# Patient Record
Sex: Male | Born: 1985 | Race: Black or African American | Hispanic: No | Marital: Married | Smoking: Current every day smoker
Health system: Southern US, Community
[De-identification: ages and names within clinical notes are randomized; demographics above are authoritative.]

## PROBLEM LIST (undated history)

## (undated) DIAGNOSIS — J45909 Unspecified asthma, uncomplicated: Secondary | ICD-10-CM

---

## 2012-10-01 ENCOUNTER — Emergency Department (HOSPITAL_COMMUNITY)
Admission: EM | Admit: 2012-10-01 | Discharge: 2012-10-01 | Disposition: A | Discharge status: Home / Self Care | Payer: Medicaid Other | Source: Home / Self Care

## 2012-10-01 ENCOUNTER — Encounter (HOSPITAL_COMMUNITY): Payer: Self-pay | Admitting: Emergency Medicine

## 2012-10-01 DIAGNOSIS — W57XXXA Bitten or stung by nonvenomous insect and other nonvenomous arthropods, initial encounter: Secondary | ICD-10-CM

## 2012-10-01 DIAGNOSIS — T148 Other injury of unspecified body region: Secondary | ICD-10-CM

## 2012-10-01 MED ORDER — PERMETHRIN 5 % EX CREA: TOPICAL_CREAM | CUTANEOUS | Status: DC

## 2012-10-01 NOTE — ED Provider Notes (Signed)
Medical screening examination/treatment/procedure(s) were performed by non-physician practitioner and as supervising physician I was immediately available for consultation/collaboration.  Leslee Home, M.D.  Reuben Likes, MD 10/01/12 2214

## 2012-10-01 NOTE — ED Notes (Signed)
Instructed to put on gown for examination by provider

## 2012-10-01 NOTE — ED Provider Notes (Signed)
History     CSN: [id number]  Arrival date & time 10/01/12  1133   First MD Initiated Contact with Patient 10/01/12 1227      Chief Complaint  Patient presents with  . Rash    (Consider location/radiation/quality/duration/timing/severity/associated sxs/prior treatment) HPI Comments: 27 year old male complaining of a rash to his proximal thighs. This rash is described as an annular darker pigmented areas approximately 2-6 mm in diameter scattered about the thighs. Sometimes they itch. He has had these before and has been to emergency department at least twice with diagnosis of bed bugs.   History reviewed. No pertinent past medical history.  History reviewed. No pertinent past surgical history.  No family history on file.  History  Substance Use Topics  . Smoking status: Current Every Day Smoker  . Smokeless tobacco: Not on file  . Alcohol Use: No      Review of Systems  All other systems reviewed and are negative.    Allergies  Review of patient's allergies indicates no known allergies.  Home Medications   Current Outpatient Rx  Name  Route  Sig  Dispense  Refill  . permethrin (ELIMITE) 5 % cream      Apply to affected area once   60 g   0     BP 121/69  Pulse 65  Temp(Src) 98.1 F (36.7 C) (Oral)  Resp 16  SpO2 100%  Physical Exam  Nursing note and vitals reviewed. Constitutional: He is oriented to person, place, and time. He appears well-developed and well-nourished. No distress.  Eyes: EOM are normal.  Neck: Normal range of motion.  Pulmonary/Chest: Effort normal.  Musculoskeletal: Normal range of motion. He exhibits no edema and no tenderness.  Neurological: He is alert and oriented to person, place, and time. He exhibits normal muscle tone.  Skin: Skin is warm and dry. Rash noted.  As described in history of present illness. No other body surface areas are involved. No bleeding, drainage, testicles or signs of infection.  Psychiatric: He has  a normal mood and affect.    ED Course  Procedures (including critical care time)  Labs Reviewed - No data to display No results found.   1. Insect bites       MDM  Eli mite cream as directed Clean close, mattress, sheets, pillows in other areas around the bed as instructed. Spray the furniture, carpet, wash her clothes and follow the instructions listed on the bed but instruction sheet. Try to find a primary care doctor.         Hayden Rasmussen, NP 10/01/12 1241

## 2012-10-01 NOTE — ED Notes (Signed)
Reports rash on thighs, red, no itching.  Patient reports this has been going on for "months"

## 2012-11-14 ENCOUNTER — Emergency Department (HOSPITAL_COMMUNITY)
Admission: EM | Admit: 2012-11-14 | Discharge: 2012-11-14 | Disposition: A | Discharge status: Home / Self Care | Payer: Medicaid Other | Attending: Emergency Medicine | Admitting: Emergency Medicine

## 2012-11-14 ENCOUNTER — Encounter (HOSPITAL_COMMUNITY): Payer: Self-pay | Admitting: Emergency Medicine

## 2012-11-14 DIAGNOSIS — J45909 Unspecified asthma, uncomplicated: Secondary | ICD-10-CM | POA: Insufficient documentation

## 2012-11-14 DIAGNOSIS — R3989 Other symptoms and signs involving the genitourinary system: Secondary | ICD-10-CM | POA: Insufficient documentation

## 2012-11-14 DIAGNOSIS — Z202 Contact with and (suspected) exposure to infections with a predominantly sexual mode of transmission: Secondary | ICD-10-CM

## 2012-11-14 DIAGNOSIS — R300 Dysuria: Secondary | ICD-10-CM | POA: Insufficient documentation

## 2012-11-14 DIAGNOSIS — R51 Headache: Secondary | ICD-10-CM | POA: Insufficient documentation

## 2012-11-14 DIAGNOSIS — F17200 Nicotine dependence, unspecified, uncomplicated: Secondary | ICD-10-CM | POA: Insufficient documentation

## 2012-11-14 HISTORY — DX: Unspecified asthma, uncomplicated: J45.909

## 2012-11-14 LAB — URINALYSIS, ROUTINE W REFLEX MICROSCOPIC
Glucose, UA: NEGATIVE mg/dL
Hgb urine dipstick: NEGATIVE
Protein, ur: NEGATIVE mg/dL

## 2012-11-14 MED ORDER — AZITHROMYCIN 250 MG PO TABS
1000.0000 mg | ORAL_TABLET | Freq: Once | ORAL | Status: AC
  Administered 2012-11-14: 1000 mg via ORAL
  Filled 2012-11-14: qty 4

## 2012-11-14 MED ORDER — CEFTRIAXONE SODIUM 250 MG IJ SOLR
250.0000 mg | Freq: Once | INTRAMUSCULAR | Status: AC
Start: 2012-11-14 — End: 2012-11-14
  Administered 2012-11-14: 250 mg via INTRAMUSCULAR
  Filled 2012-11-14: qty 250

## 2012-11-14 NOTE — ED Provider Notes (Signed)
History  This chart was scribed for non-physician practitioner working with Nathaniel Collier. Nathaniel Lamas, MD by Nathaniel Collier, ED scribe. This patient was seen in room WTR5/WTR5 and the patient's care was started at 4:36 PM.  CSN: [id number] Arrival date & time 11/14/12  1610   Chief Complaint  Patient presents with  . Migraine  . burning with urination    Patient is a 27 y.o. male presenting with migraines. The history is provided by the patient. No language interpreter was used.  Migraine This is a new problem. The current episode started more than 1 week ago. The problem occurs daily. The problem has not changed since onset.Associated symptoms include headaches. Nothing aggravates the symptoms. The symptoms are relieved by acetaminophen. He has tried acetaminophen for the symptoms. The treatment provided moderate relief.    HPI Comments: Nathaniel Collier is a 27 y.o. male who presents to the Emergency Department complaining of gradual onset, intermittent headaches that started 3 weeks ago and dysuria that started this morning. He states the migraines normally last about 30 minutes then go away. He states he has taken tylenol with some relief. Headache usually improves with sleeping.  Denies worst headache of life, fever, neck stiffness, rash.  Pt states he has lost his appetite. Pt states he would like to be tested for chlamydia because his ex partner informed him that she was treated for it one week ago. Pt denies visual disturbance, penile discharge and rash as associated symptoms.   Past Medical History  Diagnosis Date  . Asthma    History reviewed. No pertinent past surgical history. No family history on file. History  Substance Use Topics  . Smoking status: Current Every Day Smoker  . Smokeless tobacco: Not on file  . Alcohol Use: No    Review of Systems  Genitourinary: Positive for dysuria.  Neurological: Positive for headaches.  All other systems reviewed and are  negative.    Allergies  Review of patient's allergies indicates no known allergies.  Home Medications   Current Outpatient Rx  Name  Route  Sig  Dispense  Refill  . acetaminophen (TYLENOL) 500 MG tablet   Oral   Take 1,000 mg by mouth every 6 (six) hours as needed for pain.          BP 150/80  Pulse 76  Temp(Src) 98 F (36.7 C) (Oral)  Resp 17  Ht 6\' 3"  (1.905 m)  Wt 150 lb 8 oz (68.266 kg)  BMI 18.81 kg/m2  SpO2 100%  Physical Exam  Nursing note and vitals reviewed. Constitutional: He is oriented to person, place, and time. He appears well-developed and well-nourished. No distress.  Non toxic in appearance.   HENT:  Head: Normocephalic and atraumatic.  Mouth/Throat: Oropharynx is clear and moist.  Eyes: EOM are normal. Pupils are equal, round, and reactive to light.  Neck: Normal range of motion. Neck supple. No tracheal deviation present.  No meningismal signs.   Cardiovascular: Normal rate.   Pulmonary/Chest: Effort normal. No respiratory distress.  Genitourinary: Penis normal. No penile tenderness.  No hernia.  Musculoskeletal: Normal range of motion.  Neurological: He is alert and oriented to person, place, and time.  Skin: Skin is warm and dry.  Psychiatric: He has a normal mood and affect. His behavior is normal.    ED Course  Procedures (including critical care time)  DIAGNOSTIC STUDIES: Oxygen Saturation is 100% on RA, normal by my interpretation.    COORDINATION OF CARE: 4:47 PM-Discussed treatment plan which  includes OTC medication for migraine with pt at bedside and pt agreed to plan. No red flags.  No focal neuro deficits concerning for stroke, no thunderclap headache concerning for SAH, no nuchal rigidity or fever concerning for meningitis.    4:59 PM Will check UA.  Will treat for possible STD with rocephin/zithromax.  5:36 PM UA neg for infection.  Pt made aware that GC/Ch culture sent, will notify if positive.  Practice safe sex.    Labs  Reviewed  GC/CHLAMYDIA PROBE AMP  URINALYSIS, ROUTINE W REFLEX MICROSCOPIC   No results found. 1. Headache   2. Possible exposure to STD     MDM  BP 150/80  Pulse 76  Temp(Src) 98 F (36.7 C) (Oral)  Resp 17  Ht 6\' 3"  (1.905 m)  Wt 150 lb 8 oz (68.266 kg)  BMI 18.81 kg/m2  SpO2 100%  I personally performed the services described in this documentation, which was scribed in my presence. The recorded information has been reviewed and is accurate.     Fayrene Helper, PA-C 11/14/12 1737

## 2012-11-14 NOTE — ED Provider Notes (Signed)
Medical screening examination/treatment/procedure(s) were performed by non-physician practitioner and as supervising physician I was immediately available for consultation/collaboration.   [name]. Jovana Rembold, MD 11/14/12 1750

## 2012-11-14 NOTE — ED Notes (Signed)
Pt states that he has been having migraine and no appetite and weakness times three weeks.  Pt also requesting to be tested for chlamydia sue to pt's ex partner informed him that she was treated for it week ago.  Pt states that he had stinging/burning when he urinated this morning.

## 2014-09-05 DIAGNOSIS — L738 Other specified follicular disorders: Secondary | ICD-10-CM | POA: Insufficient documentation

## 2015-01-12 ENCOUNTER — Encounter (HOSPITAL_COMMUNITY): Payer: Self-pay | Admitting: *Deleted

## 2015-01-12 ENCOUNTER — Emergency Department (HOSPITAL_COMMUNITY)
Admission: EM | Admit: 2015-01-12 | Discharge: 2015-01-12 | Disposition: A | Discharge status: Home / Self Care | Payer: Medicaid Other | Source: Home / Self Care | Attending: Family Medicine | Admitting: Family Medicine

## 2015-01-12 DIAGNOSIS — J300 Vasomotor rhinitis: Secondary | ICD-10-CM

## 2015-01-12 MED ORDER — METHYLPREDNISOLONE ACETATE 80 MG/ML IJ SUSP
INTRAMUSCULAR | Status: AC
  Filled 2015-01-12: qty 1

## 2015-01-12 MED ORDER — IPRATROPIUM BROMIDE 0.06 % NA SOLN: 2.0000 | Freq: Four times a day (QID) | NASAL | Status: DC

## 2015-01-12 MED ORDER — ALBUTEROL SULFATE HFA 108 (90 BASE) MCG/ACT IN AERS: 2.0000 | INHALATION_SPRAY | Freq: Four times a day (QID) | RESPIRATORY_TRACT | Status: DC | PRN

## 2015-01-12 MED ORDER — METHYLPREDNISOLONE ACETATE 80 MG/ML IJ SUSP
80.0000 mg | Freq: Once | INTRAMUSCULAR | Status: AC
  Administered 2015-01-12: 80 mg via INTRAMUSCULAR

## 2015-01-12 NOTE — ED Notes (Signed)
Pt  Reports  Symptoms  Of   Cough  Congestion  Nasal  stuffyness    With  Onset  Of symptoms  X  4  Days        Pt  States  Was  At the  [facility]  When the  Symptoms  Began     -  Pt     Reports   Has  A  History of  Asthma  But takes no meds    He  Is  A  smoker

## 2015-01-12 NOTE — ED Provider Notes (Signed)
CSN: [id number]     Arrival date & time 01/12/15  1355 History   First MD Initiated Contact with Patient 01/12/15 1421     Chief Complaint  Patient presents with  . URI   (Consider location/radiation/quality/duration/timing/severity/associated sxs/prior Treatment) Patient is a 29 y.o. male presenting with URI. The history is provided by the patient.  URI Presenting symptoms: congestion, cough and rhinorrhea   Presenting symptoms: no fever   Severity:  Moderate Onset quality:  Gradual Duration:  4 days Progression:  Unchanged Chronicity:  New Relieved by:  None tried Exacerbated by: smoking, A/C. Ineffective treatments:  None tried (does not have an asthma mdi) Associated symptoms: wheezing     Past Medical History  Diagnosis Date  . Asthma    History reviewed. No pertinent past surgical history. History reviewed. No pertinent family history. Social History  Substance Use Topics  . Smoking status: Current Every Day Smoker  . Smokeless tobacco: None  . Alcohol Use: No    Review of Systems  Constitutional: Negative.  Negative for fever.  HENT: Positive for congestion and rhinorrhea.   Respiratory: Positive for cough and wheezing. Negative for shortness of breath.   Cardiovascular: Negative.     Allergies  Review of patient's allergies indicates no known allergies.  Home Medications   Prior to Admission medications   Medication Sig Start Date End Date Taking? Authorizing Provider  acetaminophen (TYLENOL) 500 MG tablet Take 1,000 mg by mouth every 6 (six) hours as needed for pain.    Historical Provider, MD  albuterol (PROVENTIL HFA;VENTOLIN HFA) 108 (90 BASE) MCG/ACT inhaler Inhale 2 puffs into the lungs every 6 (six) hours as needed for wheezing or shortness of breath. 01/12/15   Linna Hoff, MD  ipratropium (ATROVENT) 0.06 % nasal spray Place 2 sprays into both nostrils 4 (four) times daily. 01/12/15   Linna Hoff, MD   BP 121/80 mmHg  Pulse 81  Temp(Src) 98.3  F (36.8 C) (Oral)  Resp 18  SpO2 99% Physical Exam  Constitutional: He is oriented to person, place, and time. He appears well-developed and well-nourished. No distress.  HENT:  Right Ear: External ear normal.  Left Ear: External ear normal.  Nose: Mucosal edema and rhinorrhea present.  Mouth/Throat: Oropharynx is clear and moist.  Eyes: Pupils are equal, round, and reactive to light.  Neck: Normal range of motion. Neck supple.  Cardiovascular: Normal rate, regular rhythm, normal heart sounds and intact distal pulses.   Pulmonary/Chest: Effort normal and breath sounds normal. He has no wheezes. He has no rales.  Lymphadenopathy:    He has no cervical adenopathy.  Neurological: He is alert and oriented to person, place, and time.  Skin: Skin is warm and dry.  Nursing note and vitals reviewed.   ED Course  Procedures (including critical care time) Labs Review Labs Reviewed - No data to display  Imaging Review No results found.   MDM   1. Vasomotor rhinitis        Linna Hoff, MD 01/12/15 1444

## 2015-01-12 NOTE — Discharge Instructions (Signed)
Take all of medicine, drink lots of fluids, no more smoking, see your doctor if further problems  °

## 2015-01-20 ENCOUNTER — Emergency Department (HOSPITAL_COMMUNITY): Payer: Worker's Compensation

## 2015-01-20 ENCOUNTER — Emergency Department (HOSPITAL_COMMUNITY)
Admission: EM | Admit: 2015-01-20 | Discharge: 2015-01-20 | Disposition: A | Discharge status: Home / Self Care | Payer: Worker's Compensation | Attending: Emergency Medicine | Admitting: Emergency Medicine

## 2015-01-20 ENCOUNTER — Encounter (HOSPITAL_COMMUNITY): Payer: Self-pay | Admitting: Emergency Medicine

## 2015-01-20 DIAGNOSIS — T148XXA Other injury of unspecified body region, initial encounter: Secondary | ICD-10-CM

## 2015-01-20 DIAGNOSIS — Z79899 Other long term (current) drug therapy: Secondary | ICD-10-CM | POA: Diagnosis not present

## 2015-01-20 DIAGNOSIS — X58XXXA Exposure to other specified factors, initial encounter: Secondary | ICD-10-CM | POA: Diagnosis not present

## 2015-01-20 DIAGNOSIS — Y990 Civilian activity done for income or pay: Secondary | ICD-10-CM | POA: Diagnosis not present

## 2015-01-20 DIAGNOSIS — S8012XA Contusion of left lower leg, initial encounter: Secondary | ICD-10-CM | POA: Diagnosis not present

## 2015-01-20 DIAGNOSIS — Y9389 Activity, other specified: Secondary | ICD-10-CM | POA: Insufficient documentation

## 2015-01-20 DIAGNOSIS — Y9289 Other specified places as the place of occurrence of the external cause: Secondary | ICD-10-CM | POA: Diagnosis not present

## 2015-01-20 DIAGNOSIS — J45909 Unspecified asthma, uncomplicated: Secondary | ICD-10-CM | POA: Diagnosis not present

## 2015-01-20 DIAGNOSIS — S80812A Abrasion, left lower leg, initial encounter: Secondary | ICD-10-CM | POA: Diagnosis not present

## 2015-01-20 DIAGNOSIS — Z720 Tobacco use: Secondary | ICD-10-CM | POA: Insufficient documentation

## 2015-01-20 DIAGNOSIS — S99922A Unspecified injury of left foot, initial encounter: Secondary | ICD-10-CM | POA: Diagnosis present

## 2015-01-20 DIAGNOSIS — S9032XA Contusion of left foot, initial encounter: Secondary | ICD-10-CM | POA: Diagnosis not present

## 2015-01-20 MED ORDER — TRAMADOL HCL 50 MG PO TABS: 50.0000 mg | ORAL_TABLET | Freq: Four times a day (QID) | ORAL | Status: DC | PRN

## 2015-01-20 MED ORDER — IBUPROFEN 200 MG PO TABS
600.0000 mg | ORAL_TABLET | Freq: Once | ORAL | Status: AC
  Administered 2015-01-20: 600 mg via ORAL
  Filled 2015-01-20: qty 3

## 2015-01-20 MED ORDER — IBUPROFEN 600 MG PO TABS: 600.0000 mg | ORAL_TABLET | Freq: Four times a day (QID) | ORAL | Status: DC | PRN

## 2015-01-20 NOTE — ED Notes (Signed)
Pt's left foot was run over by a heavy machine at [facility] of how heavy the machine was. Having increased pain and decreased ROM with second and third toes. Has small abrasion on left lower leg. Unsure of last tetanus shot. 1+ pedal pulses noted.

## 2015-01-20 NOTE — Discharge Instructions (Signed)
Contusion A contusion is a deep bruise. Contusions happen when an injury causes bleeding under the skin. Signs of bruising include pain, puffiness (swelling), and discolored skin. The contusion may turn blue, purple, or yellow. HOME CARE   Put ice on the injured area.  Put ice in a plastic bag.  Place a towel between your skin and the bag.  Leave the ice on for 15-20 minutes, 03-04 times a day.  Only take medicine as told by your doctor.  Rest the injured area.  If possible, raise (elevate) the injured area to lessen puffiness. GET HELP RIGHT AWAY IF:   You have more bruising or puffiness.  You have pain that is getting worse.  Your puffiness or pain is not helped by medicine. MAKE SURE YOU:   Understand these instructions.  Will watch your condition.  Will get help right away if you are not doing well or get worse. Document Released: 10/23/2007 Document Revised: 07/29/2011 Document Reviewed: 03/11/2011 [facility] Patient Information 2015 [place], [place]. This information is not intended to replace advice given to you by your health care provider. Make sure you discuss any questions you have with your health care provider.  Abrasions An abrasion is a cut or scrape of the skin. Abrasions do not go through all layers of the skin. HOME CARE  If a bandage (dressing) was put on your wound, change it as told by your doctor. If the bandage sticks, soak it off with warm.  Wash the area with water and soap 2 times a day. Rinse off the soap. Pat the area dry with a clean towel.  Put on medicated cream (ointment) as told by your doctor.  Change your bandage right away if it gets wet or dirty.  Only take medicine as told by your doctor.  See your doctor within 24-48 hours to get your wound checked.  Check your wound for redness, puffiness (swelling), or yellowish-white fluid (pus). GET HELP RIGHT AWAY IF:   You have more pain in the wound.  You have redness, swelling, or  tenderness around the wound.  You have pus coming from the wound.  You have a fever or lasting symptoms for more than 2-3 days.  You have a fever and your symptoms suddenly get worse.  You have a bad smell coming from the wound or bandage. MAKE SURE YOU:   Understand these instructions.  Will watch your condition.  Will get help right away if you are not doing well or get worse. Document Released: 10/23/2007 Document Revised: 01/29/2012 Document Reviewed: 04/09/2011 [facility] Patient Information 2015 [place], [place]. This information is not intended to replace advice given to you by your health care provider. Make sure you discuss any questions you have with your health care provider. Your  x-rays are negative for any fracture or dislocation

## 2015-01-20 NOTE — ED Notes (Signed)
Ace wrap applied to foot. Able to bear weight. Work note given. Sent home with ice pack. Knows to apply neosporin on left leg abrasion when returning home. Has PCP-knows if pain persists to follow up with PCP. No other c/c.

## 2015-01-20 NOTE — ED Provider Notes (Signed)
CSN: [id number]     Arrival date & time 01/20/15  0030 History   First MD Initiated Contact with Patient 01/20/15 0036     Chief Complaint  Patient presents with  . Foot Injury     (Consider location/radiation/quality/duration/timing/severity/associated sxs/prior Treatment) HPI Comments: patient state he was at work and his pallet jack wouldn't stop before he had to get on another piece of equipment and it ran over his left foot that was in a sneaker and his shin hit a pole.  He now has an abrasion to the anterior portion of his left shin abrasion to the tops of the second and third toe swelling to mid foot and tenderness from toes to knee  Patient is a 29 y.o. male presenting with foot injury. The history is provided by the patient.  Foot Injury Location:  Leg and foot Injury: yes   Mechanism of injury comment:  Run over by pallet jack Leg location:  L leg Foot location:  L foot Pain details:    Quality:  Aching   Radiates to:  Does not radiate   Severity:  Moderate   Onset quality:  Sudden   Timing:  Constant Chronicity:  New Dislocation: no   Foreign body present:  No foreign bodies Prior injury to area:  No Relieved by:  None tried Worsened by:  Activity Ineffective treatments:  None tried Associated symptoms: stiffness   Associated symptoms: no swelling and no tingling     Past Medical History  Diagnosis Date  . Asthma    History reviewed. No pertinent past surgical history. History reviewed. No pertinent family history. Social History  Substance Use Topics  . Smoking status: Current Every Day Smoker  . Smokeless tobacco: None  . Alcohol Use: No    Review of Systems  Musculoskeletal: Positive for arthralgias and stiffness.  Skin: Positive for wound.  All other systems reviewed and are negative.     Allergies  Review of patient's allergies indicates no known allergies.  Home Medications   Prior to Admission medications   Medication Sig Start Date End  Date Taking? Authorizing Provider  acetaminophen (TYLENOL) 500 MG tablet Take 1,000 mg by mouth every 6 (six) hours as needed for pain.    Historical Provider, MD  albuterol (PROVENTIL HFA;VENTOLIN HFA) 108 (90 BASE) MCG/ACT inhaler Inhale 2 puffs into the lungs every 6 (six) hours as needed for wheezing or shortness of breath. 01/12/15   Linna Hoff, MD  ibuprofen (ADVIL,MOTRIN) 600 MG tablet Take 1 tablet (600 mg total) by mouth every 6 (six) hours as needed. 01/20/15   Earley Favor, NP  ipratropium (ATROVENT) 0.06 % nasal spray Place 2 sprays into both nostrils 4 (four) times daily. 01/12/15   Linna Hoff, MD  traMADol (ULTRAM) 50 MG tablet Take 1 tablet (50 mg total) by mouth every 6 (six) hours as needed. 01/20/15   Earley Favor, NP   BP 150/93 mmHg  Pulse 63  Temp(Src) 98.7 F (37.1 C)  Resp 20  SpO2 98% Physical Exam  Constitutional: He is oriented to person, place, and time. He appears well-developed and well-nourished.  HENT:  Head: Normocephalic.  Eyes: Pupils are equal, round, and reactive to light.  Neck: Normal range of motion.  Cardiovascular: Normal rate.   Pulmonary/Chest: Effort normal.  Musculoskeletal: He exhibits tenderness. He exhibits no edema.       Legs:      Feet:  Neurological: He is alert and oriented to person, place, and  time.  Nursing note and vitals reviewed.   ED Course  Procedures (including critical care time) Labs Review Labs Reviewed - No data to display  Imaging Review Dg Tibia/fibula Left  01/20/2015   CLINICAL DATA:  Left foot and leg ran over by machine at work, with pain across the metatarsals, and laceration at the anterior distal lower leg. Initial encounter.  EXAM: LEFT TIBIA AND FIBULA - 2 VIEW  COMPARISON:  None.  FINDINGS: There is no evidence of fracture or dislocation. The tibia and fibula appear grossly intact. The known soft tissue laceration is not well characterized on radiograph. No radiopaque foreign bodies are seen.  The knee  joint is grossly unremarkable in appearance. No knee joint effusion is identified. The ankle mortise is incompletely assessed, but appears grossly unremarkable.  IMPRESSION: No evidence of fracture or dislocation.   Electronically Signed   By: Roanna Raider M.D.   On: 01/20/2015 01:26   Dg Foot Complete Left  01/20/2015   CLINICAL DATA:  Left foot and leg ran over by machine at work, with pain across the metatarsals, and laceration at the anterior distal lower leg. Initial encounter.  EXAM: LEFT FOOT - COMPLETE 3+ VIEW  COMPARISON:  None.  FINDINGS: There is no evidence of fracture or dislocation. The joint spaces are preserved. There is no evidence of talar subluxation; the subtalar joint is unremarkable in appearance.  No significant soft tissue abnormalities are seen.  IMPRESSION: No evidence of fracture or dislocation.   Electronically Signed   By: Roanna Raider M.D.   On: 01/20/2015 01:21   I have personally reviewed and evaluated these images and lab results as part of my medical decision-making.   EKG Interpretation None    x-rays are negative for fracture or dislocation  MDM   Final diagnoses:  Foot contusion, left, initial encounter  Contusion of leg, left, initial encounter  Abrasion         Earley Favor, NP 01/20/15 0136  Pricilla Loveless, MD 01/21/15 1610

## 2015-04-10 ENCOUNTER — Emergency Department (HOSPITAL_COMMUNITY)
Admission: EM | Admit: 2015-04-10 | Discharge: 2015-04-10 | Disposition: A | Discharge status: Home / Self Care | Payer: Medicaid Other | Attending: Emergency Medicine | Admitting: Emergency Medicine

## 2015-04-10 ENCOUNTER — Encounter (HOSPITAL_COMMUNITY): Payer: Self-pay | Admitting: *Deleted

## 2015-04-10 DIAGNOSIS — J45909 Unspecified asthma, uncomplicated: Secondary | ICD-10-CM | POA: Insufficient documentation

## 2015-04-10 DIAGNOSIS — S39012A Strain of muscle, fascia and tendon of lower back, initial encounter: Secondary | ICD-10-CM | POA: Diagnosis not present

## 2015-04-10 DIAGNOSIS — Y9389 Activity, other specified: Secondary | ICD-10-CM | POA: Diagnosis not present

## 2015-04-10 DIAGNOSIS — M545 Low back pain, unspecified: Secondary | ICD-10-CM

## 2015-04-10 DIAGNOSIS — F17210 Nicotine dependence, cigarettes, uncomplicated: Secondary | ICD-10-CM | POA: Insufficient documentation

## 2015-04-10 DIAGNOSIS — X500XXA Overexertion from strenuous movement or load, initial encounter: Secondary | ICD-10-CM | POA: Insufficient documentation

## 2015-04-10 DIAGNOSIS — Y9289 Other specified places as the place of occurrence of the external cause: Secondary | ICD-10-CM | POA: Diagnosis not present

## 2015-04-10 DIAGNOSIS — Y990 Civilian activity done for income or pay: Secondary | ICD-10-CM | POA: Diagnosis not present

## 2015-04-10 DIAGNOSIS — S3992XA Unspecified injury of lower back, initial encounter: Secondary | ICD-10-CM | POA: Diagnosis present

## 2015-04-10 MED ORDER — IBUPROFEN 800 MG PO TABS
800.0000 mg | ORAL_TABLET | Freq: Once | ORAL | Status: AC
  Administered 2015-04-10: 800 mg via ORAL
  Filled 2015-04-10: qty 1

## 2015-04-10 MED ORDER — METHOCARBAMOL 500 MG PO TABS: 500.0000 mg | ORAL_TABLET | Freq: Two times a day (BID) | ORAL | Status: DC

## 2015-04-10 MED ORDER — NAPROXEN 500 MG PO TABS: 500.0000 mg | ORAL_TABLET | Freq: Two times a day (BID) | ORAL | Status: DC

## 2015-04-10 NOTE — ED Notes (Signed)
Pt states that he "pulled" his back at work and it has been bothering him; pt states that it is worse after laying down and it takes him "awhile" to get moving in the morning; pt states that he asked his employer for time off to recover and find a MD but has been unable to do so; pt denies numbness, tingling or weakness; pt also states that he is out of his inhaler for asthma

## 2015-04-10 NOTE — ED Provider Notes (Signed)
CSN: [id number]     Arrival date & time 04/10/15  2143 History  By signing my name below, I, Octavia Heirrianna Nassar, attest that this documentation has been prepared under the direction and in the presence of TX[employer] CorpHannah Shihab States, PA-C. Electronically Signed: Octavia HeirArianna Nassar, ED Scribe. 04/10/2015. 11:35 PM.    Chief Complaint  Patient presents with  . Back Pain     The history is provided by the patient and medical records. No language interpreter was used.   HPI Comments: Glenford Bayleynthony Cryan is a 29 y.o. male who presents to the Emergency Department complaining of constant, gradual worsening lower back pain onset two weeks ago. Pt describes the pain as sharp tight pain that increases with certain movement. Pt has been taking 400mg  of ibuprofen to alleviate the pain with no relief. He injured his back two weeks ago at his job lifting some heavy objects. Pt denies numbness, tingling in legs, weakness, bowel/bladder incontinence, burning with urination, hx of IV drug use, hx of back problems, and hx of back surgeries.  Past Medical History  Diagnosis Date  . Asthma    History reviewed. No pertinent past surgical history. No family history on file. Social History  Substance Use Topics  . Smoking status: Current Every Day Smoker -- 0.30 packs/day    Types: Cigarettes  . Smokeless tobacco: None  . Alcohol Use: Yes     Comment: socilally    Review of Systems  Constitutional: Negative for fever and fatigue.  Respiratory: Negative for chest tightness and shortness of breath.   Cardiovascular: Negative for chest pain.  Gastrointestinal: Negative for nausea, vomiting, abdominal pain and diarrhea.  Genitourinary: Negative for dysuria, urgency, frequency and hematuria.  Musculoskeletal: Positive for back pain and gait problem ( 2/2 pain). Negative for joint swelling, neck pain and neck stiffness.  Skin: Negative for rash.  Neurological: Negative for weakness, light-headedness, numbness and headaches.  All  other systems reviewed and are negative.     Allergies  Review of patient's allergies indicates no known allergies.  Home Medications   Prior to Admission medications   Medication Sig Start Date End Date Taking? Authorizing Provider  acetaminophen (TYLENOL) 500 MG tablet Take 1,000 mg by mouth every 6 (six) hours as needed for pain.   Yes Historical Provider, MD  albuterol (PROVENTIL) (2.5 MG/3ML) 0.083% nebulizer solution Take 2.5 mg by nebulization every 6 (six) hours as needed for wheezing or shortness of breath.   Yes Historical Provider, MD  methocarbamol (ROBAXIN) 500 MG tablet Take 1 tablet (500 mg total) by mouth 2 (two) times daily. 04/10/15   Deonta Bomberger, PA-C  naproxen (NAPROSYN) 500 MG tablet Take 1 tablet (500 mg total) by mouth 2 (two) times daily with a meal. 04/10/15   Raeshawn Vo, PA-C   Triage vitals: BP 126/79 mmHg  Pulse 58  Temp(Src) 98.2 F (36.8 C) (Temporal)  Resp 18  Ht 6\' 2"  (1.88 m)  Wt 149 lb (67.586 kg)  BMI 19.12 kg/m2  SpO2 100% Physical Exam  Constitutional: He appears well-developed and well-nourished. No distress.  HENT:  Head: Normocephalic and atraumatic.  Mouth/Throat: Oropharynx is clear and moist. No oropharyngeal exudate.  Eyes: Conjunctivae are normal.  Neck: Normal range of motion. Neck supple.  Full ROM without pain  Cardiovascular: Normal rate, regular rhythm, normal heart sounds and intact distal pulses.   No murmur heard. Pulmonary/Chest: Effort normal and breath sounds normal. No respiratory distress. He has no wheezes.  Abdominal: Soft. He exhibits no distension. There  is no tenderness.  Musculoskeletal:  Full range of motion of the T-spine and L-spine No tenderness to palpation of the spinous processes of the T-spine or L-spine Mild tenderness to palpation of the paraspinous muscles of the L-spine  Lymphadenopathy:    He has no cervical adenopathy.  Neurological: He is alert. He has normal reflexes.  Reflex  Scores:      Bicep reflexes are 2+ on the right side and 2+ on the left side.      Brachioradialis reflexes are 2+ on the right side and 2+ on the left side.      Patellar reflexes are 2+ on the right side and 2+ on the left side.      Achilles reflexes are 2+ on the right side and 2+ on the left side. Speech is clear and goal oriented, follows commands Normal 5/5 strength in upper and lower extremities bilaterally including dorsiflexion and plantar flexion, strong and equal grip strength Sensation normal to light and sharp touch Moves extremities without ataxia, coordination intact Normal gait Normal balance No Clonus   Skin: Skin is warm and dry. No rash noted. He is not diaphoretic. No erythema.  Psychiatric: He has a normal mood and affect. His behavior is normal.  Nursing note and vitals reviewed.   ED Course  Procedures  DIAGNOSTIC STUDIES: Oxygen Saturation is 100% on RA, normal by my interpretation.  COORDINATION OF CARE:  11:33 PM Discussed treatment plan with pt at bedside and pt agreed to plan.  Labs Review Labs Reviewed - No data to display  Imaging Review No results found. I have personally reviewed and evaluated these images and lab results as part of my medical decision-making.   EKG Interpretation None      MDM   Final diagnoses:  Bilateral low back pain without sciatica  Lumbar strain, initial encounter   [place] Kiss presents with nontraumatic back pain.  No neurological deficits and normal neuro exam.  Patient can walk but states is painful.  No loss of bowel or bladder control.  No concern for cauda equina.  No fever, night sweats, weight loss, h/o cancer, IVDU.  RICE protocol and pain medicine indicated and discussed with patient.   BP 126/79 mmHg  Pulse 58  Temp(Src) 98.2 F (36.8 C) (Temporal)  Resp 18  Ht  (1.88 m)  Wt 67.586 kg  BMI 19.12 kg/m2  SpO2 100%    Dierdre Forth, PA-C 04/11/15 [id number]  Benjiman Core,  MD 04/17/15 [phone]

## 2015-04-10 NOTE — Discharge Instructions (Signed)
1. Medications: robaxin, naproxyn, usual home medications °2. Treatment: rest, drink plenty of fluids, gentle stretching as discussed, alternate ice and heat °3. Follow Up: Please followup with your primary doctor in 3 days for discussion of your diagnoses and further evaluation after today's visit; if you do not have a primary care doctor use the resource guide provided to find one;  Return to the ER for worsening back pain, difficulty walking, loss of bowel or bladder control or other concerning symptoms ° ° ° ° °Back Exercises °The following exercises strengthen the muscles that help to support the back. They also help to keep the lower back flexible. Doing these exercises can help to prevent back pain or lessen existing pain. °If you have back pain or discomfort, try doing these exercises 2-3 times each day or as told by your health care provider. When the pain goes away, do them once each day, but increase the number of times that you repeat the steps for each exercise (do more repetitions). If you do not have back pain or discomfort, do these exercises once each day or as told by your health care provider. °EXERCISES °Single Knee to Chest °Repeat these steps 3-5 times for each leg: °1. Lie on your back on a firm bed or the floor with your legs extended. °2. Bring one knee to your chest. Your other leg should stay extended and in contact with the floor. °3. Hold your knee in place by grabbing your knee or thigh. °4. Pull on your knee until you feel a gentle stretch in your lower back. °5. Hold the stretch for 10-30 seconds. °6. Slowly release and straighten your leg. °Pelvic Tilt °Repeat these steps 5-10 times: °1. Lie on your back on a firm bed or the floor with your legs extended. °2. Bend your knees so they are pointing toward the ceiling and your feet are flat on the floor. °3. Tighten your lower abdominal muscles to press your lower back against the floor. This motion will tilt your pelvis so your tailbone  points up toward the ceiling instead of pointing to your feet or the floor. °4. With gentle tension and even breathing, hold this position for 5-10 seconds. °Cat-Cow °Repeat these steps until your lower back becomes more flexible: °1. Get into a hands-and-knees position on a firm surface. Keep your hands under your shoulders, and keep your knees under your hips. You may place padding under your knees for comfort. °2. Let your head hang down, and point your tailbone toward the floor so your lower back becomes rounded like the back of a cat. °3. Hold this position for 5 seconds. °4. Slowly lift your head and point your tailbone up toward the ceiling so your back forms a sagging arch like the back of a cow. °5. Hold this position for 5 seconds. °Press-Ups °Repeat these steps 5-10 times: °1. Lie on your abdomen (face-down) on the floor. °2. Place your palms near your head, about shoulder-width apart. °3. While you keep your back as relaxed as possible and keep your hips on the floor, slowly straighten your arms to raise the top half of your body and lift your shoulders. Do not use your back muscles to raise your upper torso. You may adjust the placement of your hands to make yourself more comfortable. °4. Hold this position for 5 seconds while you keep your back relaxed. °5. Slowly return to lying flat on the floor. °Bridges °Repeat these steps 10 times: °1. Lie on your   on your back on a firm surface. 2. Bend your knees so they are pointing toward the ceiling and your feet are flat on the floor. 3. Tighten your buttocks muscles and lift your buttocks off of the floor until your waist is at almost the same height as your knees. You should feel the muscles working in your buttocks and the back of your thighs. If you do not feel these muscles, slide your feet 1-2 inches farther away from your buttocks. 4. Hold this position for 3-5 seconds. 5. Slowly lower your hips to the starting position, and allow your buttocks  muscles to relax completely. If this exercise is too easy, try doing it with your arms crossed over your chest. Abdominal Crunches Repeat these steps 5-10 times: 1. Lie on your back on a firm bed or the floor with your legs extended. 2. Bend your knees so they are pointing toward the ceiling and your feet are flat on the floor. 3. Cross your arms over your chest. 4. Tip your chin slightly toward your chest without bending your neck. 5. Tighten your abdominal muscles and slowly raise your trunk (torso) high enough to lift your shoulder blades a tiny bit off of the floor. Avoid raising your torso higher than that, because it can put too much stress on your low back and it does not help to strengthen your abdominal muscles. 6. Slowly return to your starting position. Back Lifts Repeat these steps 5-10 times: 1. Lie on your abdomen (face-down) with your arms at your sides, and rest your forehead on the floor. 2. Tighten the muscles in your legs and your buttocks. 3. Slowly lift your chest off of the floor while you keep your hips pressed to the floor. Keep the back of your head in line with the curve in your back. Your eyes should be looking at the floor. 4. Hold this position for 3-5 seconds. 5. Slowly return to your starting position. SEEK MEDICAL CARE IF:  Your back pain or discomfort gets much worse when you do an exercise.  Your back pain or discomfort does not lessen within 2 hours after you exercise. If you have any of these problems, stop doing these exercises right away. Do not do them again unless your health care provider says that you can. SEEK IMMEDIATE MEDICAL CARE IF:  You develop sudden, severe back pain. If this happens, stop doing the exercises right away. Do not do them again unless your health care provider says that you can.   This information is not intended to replace advice given to you by your health care provider. Make sure you discuss any questions you have with your  health care provider.   Document Released: 06/13/2004 Document Revised: 01/25/2015 Document Reviewed: 06/30/2014 Elsevier Interactive Patient Education 2016 [employer].    Emergency Department Resource Guide 1) Find a Doctor and Pay Out of Pocket Although you won't have to find out who is covered by your insurance plan, it is a good idea to ask around and get recommendations. You will then need to call the office and see if the doctor you have chosen will accept you as a new patient and what types of options they offer for patients who are self-pay. Some doctors offer discounts or will set up payment plans for their patients who do not have insurance, but you will need to ask so you aren't surprised when you get to your appointment.  2) Contact Your Local Health Department Not all health  doctors that can see patients for sick visits, but many do, so it is worth a call to see if yours does. If you don't know where your local health department is, you can check in your phone book. The CDC also has a tool to help you locate your state's health department, and many state websites also have listings of all of their local health departments. ° °3) Find a Walk-in Clinic °If your illness is not likely to be very severe or complicated, you may want to try a walk in clinic. These are popping up all over the country in pharmacies, drugstores, and shopping centers. They're usually staffed by nurse practitioners or physician assistants that have been trained to treat common illnesses and complaints. They're usually fairly quick and inexpensive. However, if you have serious medical issues or chronic medical problems, these are probably not your best option. ° °No Primary Care Doctor: °- Call [facility] at  832-8000 - they can help you locate a primary care doctor that  accepts your insurance, provides certain services, etc. °- Physician Referral Service- 1-800-533-3463 ° °Chronic Pain  Problems: °Organization         Address  Phone   Notes  °[facility]  (336) 297-2271 Patients need to be referred by their primary care doctor.  ° °Medication Assistance: °Organization         Address  Phone   Notes  °[place] [facility] [address] °[place], [place] [address] (336) 641-8030 --Must be a resident of [place] °-- Must have NO insurance coverage whatsoever (no Medicaid/ Medicare, etc.) °-- The pt. MUST have a primary care doctor that directs their care regularly and follows them in the community °  °[facility]  (866) 331-1348   °[employer]  (888) 892-1162   ° °Agencies that provide inexpensive medical care: °Organization         Address  Phone   Notes  °[facility]  (336) 832-8035   °[facility]    (336) 832-7272   °[facility] [address] °[place], [place] [address] (336) 832-4777   °[facility] [address], °[place] (336) 271-4999   °[facility]    (336) 373-0678   °[facility]    (336) 272-1050   °[facility] ° [address], [place] Phone:  (336) 832-4444, Fax:  (336) 832-4440 Hours of Operation:  9 am - 6 pm, M-F.  Also accepts Medicaid/Medicare and self-pay.  °[facility] ° [address], [place] Phone: (336) 832-3150, Fax: (336) 832-3151. Hours of Operation:  8:30 am - 5:30 pm, M-F.  Also accepts Medicaid and self-pay.  °[facility] [place] [address], [place] Phone: (336) 878-6027   °[facility] [address], [place], [place] (336)723-1848, Ext. 123 Mondays & Thursdays: 7-9 AM.  First 15 patients are seen on a first come, first serve basis. °  ° °Medicaid-accepting [place] Providers: ° °Organization         Address  Phone   Notes  °[facility] [address], [place] (336) 641-2100 Also  accepts self-pay patients.  °[facility] [address], [place] ° (336) 856-9996   °[facility] [address],  (336) 288-8857   °[facility] [address]   [place] [address], [place] (336) 299-7000   °Veita Bland [address], [place]  ° (336) 373-1557 Only accepts  Access Medicaid patients after they have their name applied to their card.  ° °Self-Pay (no insurance) in [place]: ° °Organization         Address  Phone   Notes  °Sickle Cell Patients, [facility] [address], [place] (336) 832-1970   °[facility] [address], [place] (336) 832-4400   °[facility] ° [address], [place] (336) 992-4800   °[facility]/Dr. Osei-Bonsu ° [address] [place] [address], [place] or [address], [place] (336) 841-8500 Phone number for both [place] and [place] locations is the same.  °[facility] [address], [place] (336) 299-0000   °[facility] [address] [place] [address], [place] or [address] (336) 852-7530 °(336) 878-2260   °[facility] [address], [place] (336) 350-1642, phone; (336) 294-5005, fax Sees patients 1st and 3rd Saturday of every month.  Must not qualify for public or private insurance (i.e. Medicaid, Medicare, [place] Health Choice, Veterans' Benefits) • Household income should be no more than 200% of the poverty level •The clinic cannot treat you if you are pregnant or think you are pregnant • Sexually transmitted diseases are not treated at the clinic.  ° ° °Dental Care: °Organization         Address  Phone  Notes  °[facility] [address], [place] (336) 641-6152 Accepts children up to age 21 who are enrolled in Medicaid or [place] Health Choice; pregnant  women with a Medicaid card; and children who have applied for Medicaid or [place] Health Choice, but were declined, whose parents can pay a reduced fee at time of service.  °[facility] [place]  [address], [place] (336) 641-7733 Accepts children up to age 21 who are enrolled in Medicaid or [place] Health Choice; pregnant women with a Medicaid card; and children who have applied for Medicaid or  Health Choice, but were declined, whose parents can pay a reduced fee at time of service.  °[facility] ° [address], [place] (336) 641-4533 Patients are seen by appointment only. Walk-ins are not accepted. [facility] will see patients 18 years of age and older. °Monday - Tuesday (8am-5pm) °Most Wednesdays (8:30-5pm) °$30 per visit, cash only  °[facility] ° [address], [place] (336) 641-4533 Patients are seen by appointment only. Walk-ins are not accepted. [facility] will see patients 18 years of age and older. °One Wednesday Evening (Monthly: Volunteer Based).  $30 per visit, cash only  °[facility]  (919) 537-3737 for adults; Children under age 4, call Graduate Pediatric Dentistry at (919) [phone]. Children aged 4-14, please call (919) [phone] to request a pediatric application. ° Dental services are provided in all areas of dental care including fillings, crowns and bridges, complete and partial dentures, implants, gum treatment, root canals, and extractions. Preventive care is also provided. Treatment is provided to both adults and children. °Patients are selected via a lottery and there is often a waiting list. °  °[facility] [address], °[place] ° (336) 763-8833 [email or web address] °  °[facility] [address], [place], [place] (336)723-1848, Ext. 123 Second and Fourth Thursday of each month, opens at 6:30 AM; Clinic ends at 9 AM.  Patients are  seen on a first-come first-served basis, and a limited number are seen during each clinic.  ° °[facility] ° [address], [place], [place] (336) 723-7904   Eligibility Requirements °You must have lived in [place], [place], or [place] counties for at least the last three months. °  You cannot be eligible for state or federal sponsored healthcare insurance, including Veterans Administration, Medicaid, or Medicare. °  You generally cannot be eligible for healthcare insurance through your employer.  °  How to apply: °Eligibility screenings are held every Tuesday and Wednesday afternoon from 1:00 pm until 4:00 pm. You do not need an appointment for the interview!  °[facility] [address], [place], [place] 336-631-2330   °[facility]  336-342-8273   °[facility]  336-703-3100   °[facility]  336-570-6415   ° °Behavioral Health Resources in the Community: °Intensive Outpatient Programs °Organization         Address  Phone  Notes  °[facility] [address], [place], [place] 336-878-6098   °[facility] [address], [place], [place] 336-832-9800   °[facility] [address], [place], [place] ° 336-882-2125   °[facility] [address],  °[place], [place] 1-800-853-5163 or 336-641-4981   °Substance Abuse Resources °Organization         Address  Phone  Notes  °[facility]  336-882-2125   °[facility]  336-784-9470   °[facility]  336-285-9073   °[facility]  336-845-3988   °Residential & Outpatient Substance Abuse Program  1-800-659-3381   °Psychological Services °Organization         Address  Phone  Notes  °[facility]  336- 832-9600   °[facility]  336- 378-7881   °[facility] [address], [place] 1-800-853-5163 or 336-641-4981   ° °Mobile Crisis  Teams °Organization         Address  Phone  Notes  °[facility], Mobile Crisis Care Unit  1-877-626-1772   °Assertive °[facility] ° [address] [place], [place] 336-834-9664   °Sharon DeEsch [address] °[place] 336-554-5454   ° °Self-Help/Support Groups °Organization         Address  Phone             Notes  °[facility] - variety of support groups  336- 373-1402 Call for more information  °Narcotics Anonymous (NA), [facility] [address], °[place]  2 meetings at this location  ° °Residential Treatment Programs °Organization         Address  Phone  Notes  °[facility] [address],    °[place]  1-866-801-8205   °[facility] ° [address], [place], [place] 704-293-8524   °[facility] [address], [place] 336-845-3988 Admissions: 8am-3pm M-F  °[facility] [address]    °[place], [place] 336-841-1104   °[facility] [address], [place], [place] 336-379-7146   °[facility] [address]  °[place], [place] 336-285-9073   °[facility] - Intensive Outpatient [address], ,    336-852-3033   °[facility] [address]  °[place], [place] 1-877-615-2722 or 336-784-9470   °[facility] [address] [place], [place] 336-227-7417 Accepts Medicaid  °[facility] [address]  °[place] 1-800-659-3381 Substance Abuse/Addiction Treatment  ° °[place] Behavioral Health Resources °Organization         Address  Phone  Notes  °[facility]  (888) 581-9988   °Julie Brannon, PhD [address] [place], [place]   (336) 349-5553 or (336) 951-0000   °[facility]   [address] °[place], [place] (336) 349-4454   °[facility] [address], [place], [place] (336) 342-8316  Insurance/Medicaid/sponsorship through [facility]  °[facility] [address]                                    [place], [place] (336) 342-8316 Therapy/tele-psych/case  °[facility] [address]  ° [place], [place] (336) 349-2233    °Dr. Arfeen  (336) 349-4544   °[facility] [place]  [employer] [place] [facility] 1) [address], [place] °2) [address], [place] °3)  [address], [place] (336) 349-3220 °(336) 342-7768 ° °(336) 342-8140   °[place] [facility] (336) 342-1394 or (336) 342-3537 (After Hours)    ° ° ° °

## 2015-09-19 ENCOUNTER — Emergency Department (HOSPITAL_COMMUNITY)
Admission: EM | Admit: 2015-09-19 | Discharge: 2015-09-19 | Disposition: A | Discharge status: Home / Self Care | Payer: Medicaid Other | Attending: Emergency Medicine | Admitting: Emergency Medicine

## 2015-09-19 ENCOUNTER — Encounter (HOSPITAL_COMMUNITY): Payer: Self-pay | Admitting: Emergency Medicine

## 2015-09-19 DIAGNOSIS — L02211 Cutaneous abscess of abdominal wall: Secondary | ICD-10-CM | POA: Insufficient documentation

## 2015-09-19 DIAGNOSIS — J45909 Unspecified asthma, uncomplicated: Secondary | ICD-10-CM | POA: Insufficient documentation

## 2015-09-19 DIAGNOSIS — L0291 Cutaneous abscess, unspecified: Secondary | ICD-10-CM

## 2015-09-19 DIAGNOSIS — Z791 Long term (current) use of non-steroidal anti-inflammatories (NSAID): Secondary | ICD-10-CM | POA: Diagnosis not present

## 2015-09-19 DIAGNOSIS — R1030 Lower abdominal pain, unspecified: Secondary | ICD-10-CM | POA: Diagnosis present

## 2015-09-19 DIAGNOSIS — F17210 Nicotine dependence, cigarettes, uncomplicated: Secondary | ICD-10-CM | POA: Insufficient documentation

## 2015-09-19 DIAGNOSIS — Z79899 Other long term (current) drug therapy: Secondary | ICD-10-CM | POA: Insufficient documentation

## 2015-09-19 MED ORDER — LIDOCAINE-EPINEPHRINE (PF) 2 %-1:200000 IJ SOLN
20.0000 mL | Freq: Once | INTRAMUSCULAR | Status: AC
  Administered 2015-09-19: 20 mL
  Filled 2015-09-19 (×2): qty 20

## 2015-09-19 MED ORDER — HYDROCODONE-ACETAMINOPHEN 5-325 MG PO TABS: 2.0000 | ORAL_TABLET | ORAL | Status: DC | PRN

## 2015-09-19 MED ORDER — CEPHALEXIN 500 MG PO CAPS: 500.0000 mg | ORAL_CAPSULE | Freq: Four times a day (QID) | ORAL | Status: DC

## 2015-09-19 NOTE — ED Notes (Signed)
Pt reports tenderness and swelling in suprapubic area. Large , raised abscess noted. Pt has hx of similar abscesses. Pt has difficulty walking due to pain

## 2015-09-19 NOTE — ED Provider Notes (Signed)
CSN: [id number]     Arrival date & time 09/19/15  1459 History  By signing my name below, I, Soijett Blue, attest that this documentation has been prepared under the direction and in the presence of Gaylyn RongSamantha Ellean Firman, PA-C Electronically Signed: Soijett Blue, ED Scribe. 09/19/2015. 3:58 PM.  Chief Complaint  Patient presents with  . Abscess    Swollen, raised area on suprapubic area      The history is provided by the patient. No language interpreter was used.     Nathaniel Collier is a 30 y.o. male who presents to the Emergency Department complaining of suprapubic abscess onset yesterday worsening this morning. Pt reports that yesterday he noticed a small knot to the area and woke up this morning and the area was larger and more painful. Pt states that he last had an abscess to the area 2 years ago. Pt states that he left work to come to the ED due to the pain. He states that he has not tried medications for the relief of his symptoms. He denies fever, chills, drainage, abdominal pain, and any other symptoms. Denies PMHx of DM.   Past Medical History  Diagnosis Date  . Asthma    No past surgical history on file. No family history on file. Social History  Substance Use Topics  . Smoking status: Current Every Day Smoker -- 0.30 packs/day    Types: Cigarettes  . Smokeless tobacco: Not on file  . Alcohol Use: Yes     Comment: socilally    Review of Systems  Constitutional: Negative for fever and chills.  Gastrointestinal: Negative for abdominal pain.  Skin:       Abscess to suprapubic region without drainage at this time.   All other systems reviewed and are negative.     Allergies  Review of patient's allergies indicates no known allergies.  Home Medications   Prior to Admission medications   Medication Sig Start Date End Date Taking? Authorizing Provider  acetaminophen (TYLENOL) 500 MG tablet Take 1,000 mg by mouth every 6 (six) hours as needed for pain.    Historical  Provider, MD  albuterol (PROVENTIL) (2.5 MG/3ML) 0.083% nebulizer solution Take 2.5 mg by nebulization every 6 (six) hours as needed for wheezing or shortness of breath.    Historical Provider, MD  methocarbamol (ROBAXIN) 500 MG tablet Take 1 tablet (500 mg total) by mouth 2 (two) times daily. 04/10/15   Hannah Muthersbaugh, PA-C  naproxen (NAPROSYN) 500 MG tablet Take 1 tablet (500 mg total) by mouth 2 (two) times daily with a meal. 04/10/15   Hannah Muthersbaugh, PA-C   BP 126/82 mmHg  Pulse 99  Temp(Src) 98.7 F (37.1 C) (Oral)  Resp 18  Ht 6\' 2"  (1.88 m)  Wt 159 lb (72.122 kg)  BMI 20.41 kg/m2  SpO2 100% Physical Exam  Constitutional: He is oriented to person, place, and time. He appears well-developed and well-nourished. No distress.  HENT:  Head: Normocephalic and atraumatic.  Eyes: Conjunctivae are normal. Right eye exhibits no discharge. Left eye exhibits no discharge. No scleral icterus.  Cardiovascular: Normal rate.   Pulmonary/Chest: Effort normal.  Abdominal: Soft. There is no tenderness.  Neurological: He is alert and oriented to person, place, and time. Coordination normal.  Skin: Skin is warm and dry. No rash noted. He is not diaphoretic. There is erythema. No pallor.  2 cm x 3 cm fluctuance mass in the suprapubic region with mild surrounding erythema. No active drainage. No streaking.  Psychiatric: He has a normal mood and affect. His behavior is normal.  Nursing note and vitals reviewed.   ED Course  Procedures (including critical care time) DIAGNOSTIC STUDIES: Oxygen Saturation is 100% on RA, nl by my interpretation.    COORDINATION OF CARE: 3:27 PM Discussed treatment plan with pt at bedside which includes I&D and pt agreed to plan.  INCISION AND DRAINAGE PROCEDURE NOTE: Patient identification was confirmed and verbal consent was obtained. This procedure was performed by Gaylyn Rong, PA-C at 4:00 PM. Site: Suprapubic Sterile procedures observed:  Yes Anesthetic used (type and amt): 2% Lidocaine with epinephrine and 6 mL used Blade size: 11 Drainage: copius Complexity: Complex Packing used: No Site anesthetized, incision made over site, wound drained and explored loculations, rinsed with copious amounts of normal saline, wound packed with sterile gauze, covered with dry, sterile dressing.  Pt tolerated procedure well without complications.  Instructions for care discussed verbally and pt provided with additional written instructions for homecare and f/u.   Labs Review Labs Reviewed - No data to display  Imaging Review No results found.   EKG Interpretation None      MDM   Final diagnoses:  Abscess    Patient with skin abscess. Incision and drainage performed in the ED today.  Abscess was not large enough to warrant packing or drain placement. Wound recheck in 2 days. Supportive care and return precautions discussed.  Pt sent home with keflex and norco. The patient appears reasonably screened and/or stabilized for discharge and I doubt any other emergent medical condition requiring further screening, evaluation, or treatment in the ED prior to discharge.    I personally performed the services described in this documentation, which was scribed in my presence. The recorded information has been reviewed and is accurate.     Lester Kinsman [place], PA-C 09/19/15 1632  Cathren Laine, MD 09/19/15 [phone]

## 2015-09-19 NOTE — Discharge Instructions (Signed)
Abscess °An abscess is an infected area that contains a collection of pus and debris. It can occur in almost any part of the body. An abscess is also known as a furuncle or boil. °CAUSES  °An abscess occurs when tissue gets infected. This can occur from blockage of oil or sweat glands, infection of hair follicles, or a minor injury to the skin. As the body tries to fight the infection, pus collects in the area and creates pressure under the skin. This pressure causes pain. People with weakened immune systems have difficulty fighting infections and get certain abscesses more often.  °SYMPTOMS °Usually an abscess develops on the skin and becomes a painful mass that is red, warm, and tender. If the abscess forms under the skin, you may feel a moveable soft area under the skin. Some abscesses break open (rupture) on their own, but most will continue to get worse without care. The infection can spread deeper into the body and eventually into the bloodstream, causing you to feel ill.  °DIAGNOSIS  °Your caregiver will take your medical history and perform a physical exam. A sample of fluid may also be taken from the abscess to determine what is causing your infection. °TREATMENT  °Your caregiver may prescribe antibiotic medicines to fight the infection. However, taking antibiotics alone usually does not cure an abscess. Your caregiver may need to make a small cut (incision) in the abscess to drain the pus. In some cases, gauze is packed into the abscess to reduce pain and to continue draining the area. °HOME CARE INSTRUCTIONS  °· Only take over-the-counter or prescription medicines for pain, discomfort, or fever as directed by your caregiver. °· If you were prescribed antibiotics, take them as directed. Finish them even if you start to feel better. °· If gauze is used, follow your caregiver's directions for changing the gauze. °· To avoid spreading the infection: °· Keep your draining abscess covered with a  bandage. °· Wash your hands well. °· Do not share personal care items, towels, or whirlpools with others. °· Avoid skin contact with others. °· Keep your skin and clothes clean around the abscess. °· Keep all follow-up appointments as directed by your caregiver. °SEEK MEDICAL CARE IF:  °· You have increased pain, swelling, redness, fluid drainage, or bleeding. °· You have muscle aches, chills, or a general ill feeling. °· You have a fever. °MAKE SURE YOU:  °· Understand these instructions. °· Will watch your condition. °· Will get help right away if you are not doing well or get worse. °  °This information is not intended to replace advice given to you by your health care provider. Make sure you discuss any questions you have with your health care provider. °  °Document Released: 02/13/2005 Document Revised: 11/05/2011 Document Reviewed: 07/19/2011 °Elsevier Interactive Patient Education ©2016 [employer] ° °Incision and Drainage °Incision and drainage is a procedure in which a sac-like structure (cystic structure) is opened and drained. The area to be drained usually contains material such as pus, fluid, or blood.  °LET YOUR CAREGIVER KNOW ABOUT:  °· Allergies to medicine. °· Medicines taken, including vitamins, herbs, eyedrops, over-the-counter medicines, and creams. °· Use of steroids (by mouth or creams). °· Previous problems with anesthetics or numbing medicines. °· History of bleeding problems or blood clots. °· Previous surgery. °· Other health problems, including diabetes and kidney problems. °· Possibility of pregnancy, if this applies. °RISKS AND COMPLICATIONS °· Pain. °· Bleeding. °· Scarring. °· Infection. °BEFORE THE PROCEDURE  °  You may need to have an ultrasound or other imaging tests to see how large or deep your cystic structure is. Blood tests may also be used to determine if you have an infection or how severe the infection is. You may need to have a tetanus shot. PROCEDURE  The affected area  is cleaned with a cleaning fluid. The cyst area will then be numbed with a medicine (local anesthetic). A small incision will be made in the cystic structure. A syringe or catheter may be used to drain the contents of the cystic structure, or the contents may be squeezed out. The area will then be flushed with a cleansing solution. After cleansing the area, it is often gently packed with a gauze or another wound dressing. Once it is packed, it will be covered with gauze and tape or some other type of wound dressing. AFTER THE PROCEDURE   Often, you will be allowed to go home right after the procedure.  You may be given antibiotic medicine to prevent or heal an infection.  If the area was packed with gauze or some other wound dressing, you will likely need to come back in 1 to 2 days to get it removed.  The area should heal in about 14 days.   This information is not intended to replace advice given to you by your health care provider. Make sure you discuss any questions you have with your health care provider.   Follow-up at either urgent care or this department in 3-4 days for a wound recheck. Take antibiotics as prescribed. Change dressing as needed. He may wash the area with soap and water. Return to the emergency department if you experience severe worsening of her symptoms, increased redness or swelling around your wound, fevers, chills.

## 2016-03-11 ENCOUNTER — Emergency Department (HOSPITAL_COMMUNITY)
Admission: EM | Admit: 2016-03-11 | Discharge: 2016-03-11 | Disposition: A | Discharge status: Home / Self Care | Payer: Medicaid Other | Attending: Emergency Medicine | Admitting: Emergency Medicine

## 2016-03-11 ENCOUNTER — Encounter (HOSPITAL_COMMUNITY): Payer: Self-pay | Admitting: Emergency Medicine

## 2016-03-11 DIAGNOSIS — L0231 Cutaneous abscess of buttock: Secondary | ICD-10-CM | POA: Insufficient documentation

## 2016-03-11 DIAGNOSIS — F17210 Nicotine dependence, cigarettes, uncomplicated: Secondary | ICD-10-CM | POA: Diagnosis not present

## 2016-03-11 DIAGNOSIS — J45909 Unspecified asthma, uncomplicated: Secondary | ICD-10-CM | POA: Insufficient documentation

## 2016-03-11 DIAGNOSIS — Z23 Encounter for immunization: Secondary | ICD-10-CM | POA: Diagnosis not present

## 2016-03-11 DIAGNOSIS — L0291 Cutaneous abscess, unspecified: Secondary | ICD-10-CM

## 2016-03-11 DIAGNOSIS — Z79899 Other long term (current) drug therapy: Secondary | ICD-10-CM | POA: Diagnosis not present

## 2016-03-11 MED ORDER — BUPIVACAINE HCL (PF) 0.5 % IJ SOLN
10.0000 mL | Freq: Once | INTRAMUSCULAR | Status: AC
  Administered 2016-03-11: 10 mL
  Filled 2016-03-11: qty 30

## 2016-03-11 MED ORDER — OXYCODONE-ACETAMINOPHEN 5-325 MG PO TABS: 1.0000 | ORAL_TABLET | Freq: Four times a day (QID) | ORAL | 0 refills | Status: DC | PRN

## 2016-03-11 MED ORDER — NAPROXEN 500 MG PO TABS: 500.0000 mg | ORAL_TABLET | Freq: Two times a day (BID) | ORAL | 0 refills | Status: DC

## 2016-03-11 MED ORDER — TETANUS-DIPHTH-ACELL PERTUSSIS 5-2.5-18.5 LF-MCG/0.5 IM SUSP
0.5000 mL | Freq: Once | INTRAMUSCULAR | Status: AC
  Administered 2016-03-11: 0.5 mL via INTRAMUSCULAR
  Filled 2016-03-11: qty 0.5

## 2016-03-11 MED ORDER — OXYCODONE-ACETAMINOPHEN 5-325 MG PO TABS
1.0000 | ORAL_TABLET | Freq: Once | ORAL | Status: AC
  Administered 2016-03-11: 1 via ORAL
  Filled 2016-03-11: qty 1

## 2016-03-11 MED ORDER — SULFAMETHOXAZOLE-TRIMETHOPRIM 800-160 MG PO TABS: 1.0000 | ORAL_TABLET | Freq: Two times a day (BID) | ORAL | 0 refills | Status: AC

## 2016-03-11 MED ORDER — LIDOCAINE-EPINEPHRINE (PF) 2 %-1:200000 IJ SOLN
10.0000 mL | Freq: Once | INTRAMUSCULAR | Status: AC
  Administered 2016-03-11: 10 mL
  Filled 2016-03-11: qty 20

## 2016-03-11 NOTE — ED Notes (Signed)
Pt given discharge instructions, verbalized understanding of need to follow up, reasons to return to the ED and medications to take at home. Bandage applied at this time. Pt denies further questions or concerns. Pt able to ambulate to exit without difficulty.

## 2016-03-11 NOTE — ED Provider Notes (Signed)
[facility] Provider Note   CSN: [id number] Arrival date & time: 03/11/16  1818     History   Chief Complaint Chief Complaint  Patient presents with  . Recurrent Skin Infections    HPI Nathaniel Collier is a 30 y.o. male.  HPI   Nathaniel Collier is a 30 y.o. male, with a history of recurrent abscesses, presenting to the ED with An abscess on the left buttock that arose a few days ago. Patient has had recurrent abscesses over the last few years. Denies fever/chills, abdominal pain, difficulty with bowel movements, drainage from the area, or any other complaints.      Past Medical History:  Diagnosis Date  . Asthma     There are no active problems to display for this patient.   History reviewed. No pertinent surgical history.     Home Medications    Prior to Admission medications   Medication Sig Start Date End Date Taking? Authorizing Provider  acetaminophen (TYLENOL) 500 MG tablet Take 1,000 mg by mouth every 6 (six) hours as needed for pain.    Historical Provider, MD  albuterol (PROVENTIL) (2.5 MG/3ML) 0.083% nebulizer solution Take 2.5 mg by nebulization every 6 (six) hours as needed for wheezing or shortness of breath.    Historical Provider, MD  cephALEXin (KEFLEX) 500 MG capsule Take 1 capsule (500 mg total) by mouth 4 (four) times daily. 09/19/15   Samantha Tripp Dowless, PA-C  HYDROcodone-acetaminophen (NORCO/VICODIN) 5-325 MG tablet Take 2 tablets by mouth every 4 (four) hours as needed. 09/19/15   Samantha Tripp Dowless, PA-C  methocarbamol (ROBAXIN) 500 MG tablet Take 1 tablet (500 mg total) by mouth 2 (two) times daily. 04/10/15   Hannah Muthersbaugh, PA-C  naproxen (NAPROSYN) 500 MG tablet Take 1 tablet (500 mg total) by mouth 2 (two) times daily with a meal. 04/10/15   Hannah Muthersbaugh, PA-C  naproxen (NAPROSYN) 500 MG tablet Take 1 tablet (500 mg total) by mouth 2 (two) times daily. 03/11/16   Luddie Boghosian C Calleigh Lafontant, PA-C  oxyCODONE-acetaminophen  (PERCOCET/ROXICET) 5-325 MG tablet Take 1-2 tablets by mouth every 6 (six) hours as needed for severe pain. 03/11/16   Sesilia Poucher C [place]undra Hair, PA-C  sulfamethoxazole-trimethoprim (BACTRIM DS,SEPTRA DS) 800-160 MG tablet Take 1 tablet by mouth 2 (two) times daily. 03/11/16 03/18/16  Anselm Pancoast, PA-C    Family History Family History  Problem Relation Age of Onset  . Asthma Mother     Social History Social History  Substance Use Topics  . Smoking status: Current Every Day Smoker    Packs/day: 0.30    Types: Cigarettes  . Smokeless tobacco: Never Used  . Alcohol use No     Allergies   Review of patient's allergies indicates no known allergies.   Review of Systems Review of Systems  Constitutional: Negative for chills and fever.  Gastrointestinal: Negative for abdominal pain.  Skin: Positive for wound.  All other systems reviewed and are negative.    Physical Exam Updated Vital Signs BP (!) 148/112 (BP Location: Left Arm)   Pulse 101   Temp 98.3 F (36.8 C) (Oral)   Resp 18   Ht 6\' 2"  (1.88 m)   Wt 72.1 kg   SpO2 100%   BMI 20.41 kg/m   Physical Exam  Constitutional: He appears well-developed and well-nourished. No distress.  HENT:  Head: Normocephalic and atraumatic.  Eyes: Conjunctivae are normal.  Neck: Neck supple.  Cardiovascular: Normal rate, regular rhythm, normal heart sounds and intact distal pulses.  Pulmonary/Chest: Effort normal and breath sounds normal. No respiratory distress.  Abdominal: Soft. There is no tenderness. There is no guarding.  Musculoskeletal: He exhibits no edema or tenderness.  Lymphadenopathy:    He has no cervical adenopathy.  Neurological: He is alert.  Skin: Skin is warm and dry. He is not diaphoretic.  2 x 2 centimeter abscess in the left gluteal region. Fluctuance, erythema, and tenderness present.  Psychiatric: He has a normal mood and affect. His behavior is normal.  Nursing note and vitals reviewed.    ED Treatments / Results   Labs (all labs ordered are listed, but only abnormal results are displayed) Labs Reviewed - No data to display  EKG  EKG Interpretation None       Radiology No results found.  Procedures .Marland KitchenIncision and Drainage Date/Time: 03/11/2016 7:32 PM Performed by: Anselm Pancoast Authorized by: Harolyn Rutherford C   Consent:    Consent obtained:  Verbal   Risks discussed:  Bleeding, incomplete drainage, pain and infection   Alternatives discussed:  No treatment and alternative treatment Location:    Type:  Abscess   Size:  2x2 cm   Location:  Lower extremity   Lower extremity location:  Buttock   Buttock location:  L buttock Pre-procedure details:    Skin preparation:  Betadine Anesthesia (see MAR for exact dosages):    Anesthesia method:  Local infiltration   Local anesthetic:  Lidocaine 2% WITH epi and bupivacaine 0.5% w/o epi Procedure type:    Complexity:  Complex Procedure details:    Incision types:  Single straight   Incision depth:  Subcutaneous   Scalpel blade:  11   Wound management:  Probed and deloculated, irrigated with saline and extensive cleaning   Drainage:  Bloody and purulent   Drainage amount:  Copious   Wound treatment:  Wound left open   Packing materials:  None Post-procedure details:    Patient tolerance of procedure:  Tolerated well, no immediate complications    (including critical care time)  EMERGENCY DEPARTMENT US SOFT TISSUE INTERPRETATION "Study: Limited Ultrasound of the noted body part in comments below"  INDICATIONS: Soft tissue infection Multiple views of the body part are obtained with a multi-frequency linear probe  PERFORMED BY:  Myself  IMAGES ARCHIVED?: Yes  SIDE:Left  BODY PART:Lower extremity  FINDINGS: Abcess present and Cellulitis present  LIMITATIONS: None  INTERPRETATION:  Abcess present and Cellulitis present  COMMENT:     Medications Ordered in ED Medications  lidocaine-EPINEPHrine (XYLOCAINE W/EPI) 2  %-1:200000 (PF) injection 10 mL (10 mLs Infiltration Given 03/11/16 1939)  Tdap (BOOSTRIX) injection 0.5 mL (0.5 mLs Intramuscular Given 03/11/16 1938)  bupivacaine (MARCAINE) 0.5 % injection 10 mL (10 mLs Infiltration Given 03/11/16 1939)  oxyCODONE-acetaminophen (PERCOCET/ROXICET) 5-325 MG per tablet 1 tablet (1 tablet Oral Given 03/11/16 1937)     Initial Impression / Assessment and Plan / ED Course  I have reviewed the triage vital signs and the nursing notes.  Pertinent labs & imaging results that were available during my care of the patient were reviewed by me and considered in my medical decision making (see chart for details).  Clinical Course    Patient presents with a gluteal abscess. I&D performed. Patient to follow up with a PCP for chronic management of this issue. Return precautions discussed.   Vitals:   03/11/16 1825 03/11/16 1826  BP: (!) 148/112   Pulse: 101   Resp: 18   Temp: 98.3 F (36.8 C)  TempSrc: Oral   SpO2: 100%   Weight:  72.1 kg  Height:  6\' 2"  (1.88 m)      Final Clinical Impressions(s) / ED Diagnoses   Final diagnoses:  Abscess    New Prescriptions New Prescriptions   NAPROXEN (NAPROSYN) 500 MG TABLET    Take 1 tablet (500 mg total) by mouth 2 (two) times daily.   OXYCODONE-ACETAMINOPHEN (PERCOCET/ROXICET) 5-325 MG TABLET    Take 1-2 tablets by mouth every 6 (six) hours as needed for severe pain.   SULFAMETHOXAZOLE-TRIMETHOPRIM (BACTRIM DS,SEPTRA DS) 800-160 MG TABLET    Take 1 tablet by mouth 2 (two) times daily.     Anselm PancoastShawn C Keeghan Mcintire, PA-C 03/11/16 2007    Jacalyn LefevreJulie Haviland, MD 03/11/16 [phone]

## 2016-03-11 NOTE — ED Triage Notes (Signed)
Patient c/o boil in crack of buttock on on the left cheek. patient states that has been there about week. Has been putting warm compresses and come to head without any drainage.  Patient unable to sit in triage.

## 2016-03-11 NOTE — Discharge Instructions (Signed)
You have been seen today for an abscess. This was drained successfully here in the ED. Please take all of your antibiotics until finished!   You may develop abdominal discomfort or diarrhea from the antibiotic.  You may help offset this with probiotics which you can buy or get in yogurt. Do not eat or take the probiotics until 2 hours after your antibiotic.   Follow up with PCP for future occurrences of this nature. Return to ED should symptoms worsen.  Some people are prone to recurrent abscesses. You may try using a diluted chlorhexidine solution as a body wash once a week. Chlorhexidine is available over-the-counter.

## 2018-10-17 ENCOUNTER — Other Ambulatory Visit: Payer: Self-pay

## 2018-10-17 ENCOUNTER — Encounter (HOSPITAL_COMMUNITY): Payer: Self-pay | Admitting: Family Medicine

## 2018-10-17 ENCOUNTER — Ambulatory Visit (HOSPITAL_COMMUNITY)
Admission: EM | Admit: 2018-10-17 | Discharge: 2018-10-17 | Disposition: A | Discharge status: Home / Self Care | Payer: Medicare Other | Attending: Family Medicine | Admitting: Family Medicine

## 2018-10-17 DIAGNOSIS — W540XXA Bitten by dog, initial encounter: Secondary | ICD-10-CM

## 2018-10-17 DIAGNOSIS — S65212A Laceration of superficial palmar arch of left hand, initial encounter: Secondary | ICD-10-CM | POA: Diagnosis not present

## 2018-10-17 MED ORDER — LIDOCAINE-EPINEPHRINE (PF) 2 %-1:200000 IJ SOLN
INTRAMUSCULAR | Status: AC
  Filled 2018-10-17: qty 20

## 2018-10-17 NOTE — Discharge Instructions (Addendum)
Return in 10 days for suture removal  Come back sooner for any increasing pain, redness, swelling or discharge.

## 2018-10-17 NOTE — ED Provider Notes (Signed)
[facility]    CSN: [id number] Arrival date & time: 10/17/18  1017     History   Chief Complaint Chief Complaint  Patient presents with  . Animal Bite    HPI Nathaniel Collier is a 33 y.o. male.   This is a 33 year old man with a dog bite to his left hand.  He has not been seen here for over 3 years.  Last tetanus shot was 03/11/2016.  He washed the area thoroughly last night.  The dogs immunizations are up to date.     Past Medical History:  Diagnosis Date  . Asthma     There are no active problems to display for this patient.   History reviewed. No pertinent surgical history.     Home Medications    Prior to Admission medications   Medication Sig Start Date End Date Taking? Authorizing Provider  acetaminophen (TYLENOL) 500 MG tablet Take 1,000 mg by mouth every 6 (six) hours as needed for pain.    [provider]  albuterol (PROVENTIL) (2.5 MG/3ML) 0.083% nebulizer solution Take 2.5 mg by nebulization every 6 (six) hours as needed for wheezing or shortness of breath.    [provider]  cephALEXin (KEFLEX) 500 MG capsule Take 1 capsule (500 mg total) by mouth 4 (four) times daily. 09/19/15   Dowless, Lelon MastSamantha Tripp, PA-C  HYDROcodone-acetaminophen (NORCO/VICODIN) 5-325 MG tablet Take 2 tablets by mouth every 4 (four) hours as needed. 09/19/15   Dowless, Lelon MastSamantha Tripp, PA-C  methocarbamol (ROBAXIN) 500 MG tablet Take 1 tablet (500 mg total) by mouth 2 (two) times daily. 04/10/15   Muthersbaugh, Dahlia ClientHannah, PA-C  naproxen (NAPROSYN) 500 MG tablet Take 1 tablet (500 mg total) by mouth 2 (two) times daily with a meal. 04/10/15   Muthersbaugh, Dahlia ClientHannah, PA-C  naproxen (NAPROSYN) 500 MG tablet Take 1 tablet (500 mg total) by mouth 2 (two) times daily. 03/11/16   Joy, Shawn C, PA-C  oxyCODONE-acetaminophen (PERCOCET/ROXICET) 5-325 MG tablet Take 1-2 tablets by mouth every 6 (six) hours as needed for severe pain. 03/11/16   Joy, Shawn C, PA-C  ipratropium  (ATROVENT) 0.06 % nasal spray Place 2 sprays into both nostrils 4 (four) times daily. Patient not taking: Reported on 04/10/2015 01/12/15 04/10/15  Linna HoffKindl, James D, MD    Family History Family History  Problem Relation Age of Onset  . Asthma Mother     Social History Social History   Tobacco Use  . Smoking status: Current Every Day Smoker    Packs/day: 0.30    Types: Cigarettes  . Smokeless tobacco: Never Used  Substance Use Topics  . Alcohol use: No  . Drug use: No     Allergies   Patient has no known allergies.   Review of Systems Review of Systems   Physical Exam Triage Vital Signs ED Triage Vitals  Enc Vitals Group     BP      Pulse      Resp      Temp      Temp src      SpO2      Weight      Height      Head Circumference      Peak Flow      Pain Score      Pain Loc      Pain Edu?      Excl. in GC?    No data found.  Updated Vital Signs BP 90/61 (BP Location: Right Arm)  Pulse 60   Temp 98.3 F (36.8 C) (Oral)   Resp 18   SpO2 100%    Physical Exam Vitals signs and nursing note reviewed.  Constitutional:      Appearance: Normal appearance. He is normal weight.  HENT:     Head: Normocephalic.     Nose: Nose normal.  Eyes:     Conjunctiva/sclera: Conjunctivae normal.  Neck:     Musculoskeletal: Normal range of motion and neck supple.  Cardiovascular:     Rate and Rhythm: Normal rate.  Pulmonary:     Effort: Pulmonary effort is normal.  Musculoskeletal: Normal range of motion.  Neurological:     General: No focal deficit present.     Mental Status: He is alert and oriented to person, place, and time.     Sensory: No sensory deficit.     Coordination: Coordination normal.  Psychiatric:        Mood and Affect: Mood normal.        Behavior: Behavior normal.        UC Treatments / Results  Labs (all labs ordered are listed, but only abnormal results are displayed) Labs Reviewed - No data to display  EKG None  Radiology  No results found.  Procedures Laceration Repair Date/Time: 10/17/2018 11:25 AM Performed by: Elvina Sidle, MD Authorized by: Elvina Sidle, MD   Consent:    Consent obtained:  Verbal   Consent given by:  Patient   Risks discussed:  Infection and pain   Alternatives discussed:  No treatment and delayed treatment Anesthesia (see MAR for exact dosages):    Anesthesia method:  Local infiltration   Local anesthetic:  Lidocaine 2% WITH epi Laceration details:    Location:  Hand   Hand location:  L palm   Length (cm):  3   Depth (mm):  8 Repair type:    Repair type:  Intermediate Pre-procedure details:    Preparation:  Patient was prepped and draped in usual sterile fashion Exploration:    Wound exploration: wound explored through full range of motion     Contaminated: yes   Treatment:    Area cleansed with:  Hibiclens, soap and water and Shur-Clens   Amount of cleaning:  Extensive   Irrigation solution:  Tap water   Visualized foreign bodies/material removed: no   Skin repair:    Repair method:  Sutures   Suture size:  4-0   Suture material:  Nylon   Suture technique:  Horizontal mattress   Number of sutures:  3 Approximation:    Approximation:  Close Post-procedure details:    Dressing:  Bulky dressing   Patient tolerance of procedure:  Tolerated well, no immediate complications   (including critical care time)  Medications Ordered in UC Medications - No data to display  Initial Impression / Assessment and Plan / UC Course  I have reviewed the triage vital signs and the nursing notes.  Pertinent labs & imaging results that were available during my care of the patient were reviewed by me and considered in my medical decision making (see chart for details).    Final Clinical Impressions(s) / UC Diagnoses   Final diagnoses:  None   Discharge Instructions   None    ED Prescriptions    None     Controlled Substance Prescriptions [place] Controlled  Substance Registry consulted? Not Applicable   Elvina Sidle, MD 10/17/18 1129

## 2018-10-17 NOTE — ED Triage Notes (Signed)
Pt presents with dog bite from last night after trying to separate his dog from another dog.  Pt states dog is up to date on all vaccines.

## 2018-10-19 ENCOUNTER — Telehealth (HOSPITAL_COMMUNITY): Payer: Self-pay | Admitting: Emergency Medicine

## 2018-10-19 MED ORDER — DOXYCYCLINE HYCLATE 100 MG PO CAPS: 100.0000 mg | ORAL_CAPSULE | Freq: Two times a day (BID) | ORAL | 0 refills | Status: DC

## 2018-10-19 NOTE — Telephone Encounter (Signed)
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 Dr. Milus Glazier to write longer note, and will send doxy to preferred pharmacy.

## 2018-10-22 ENCOUNTER — Encounter (HOSPITAL_COMMUNITY): Payer: Self-pay

## 2018-10-22 ENCOUNTER — Ambulatory Visit (HOSPITAL_COMMUNITY)
Admission: EM | Admit: 2018-10-22 | Discharge: 2018-10-22 | Disposition: A | Discharge status: Home / Self Care | Payer: Medicare Other

## 2018-10-22 DIAGNOSIS — Z5189 Encounter for other specified aftercare: Secondary | ICD-10-CM

## 2018-10-22 NOTE — ED Triage Notes (Signed)
Pt states had sutures placed to palm of lt hand Saturday morning and it became infected and antibotics was given. States told to return for follow up prior to going back to work. No drainage or redness noted to wound. Sutures are intact.

## 2018-10-22 NOTE — Discharge Instructions (Signed)
Keep clean and dry  Return in another 5-7 days for removal Finish antibiotic  Follow up for any concerns

## 2018-10-22 NOTE — ED Provider Notes (Signed)
[facility]    CSN: [id number] Arrival date & time: 10/22/18  1424     History   Chief Complaint Chief Complaint  Patient presents with  . Wound Check    HPI Nathaniel Collier is a 33 y.o. male history of asthma presenting today for a wound check.  Patient was seen here on 5/30 approximately 5 days ago and had wound repair after dog bite.  Wound was repaired with sutures, but he noted pus to develop on 6/1.  He was started on doxycycline and has had decreasing pus and improvement in the redness.  He is here today to have this rechecked.  He still continues to have a lot of discomfort.  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  The wound is on his right hand.  HPI  Past Medical History:  Diagnosis Date  . Asthma     There are no active problems to display for this patient.   History reviewed. No pertinent surgical history.     Home Medications    Prior to Admission medications   Medication Sig Start Date End Date Taking? Authorizing Provider  acetaminophen (TYLENOL) 500 MG tablet Take 1,000 mg by mouth every 6 (six) hours as needed for pain.    [provider]  albuterol (PROVENTIL) (2.5 MG/3ML) 0.083% nebulizer solution Take 2.5 mg by nebulization every 6 (six) hours as needed for wheezing or shortness of breath.    [provider]  doxycycline (VIBRAMYCIN) 100 MG capsule Take 1 capsule (100 mg total) by mouth 2 (two) times daily. 10/19/18   Eustace MooreNelson, Yvonne Sue, MD  HYDROcodone-acetaminophen (NORCO/VICODIN) 5-325 MG tablet Take 2 tablets by mouth every 4 (four) hours as needed. 09/19/15   Dowless, Lelon MastSamantha Tripp, PA-C  methocarbamol (ROBAXIN) 500 MG tablet Take 1 tablet (500 mg total) by mouth 2 (two) times daily. 04/10/15   Muthersbaugh, Dahlia ClientHannah, PA-C  naproxen (NAPROSYN) 500 MG tablet Take 1 tablet (500 mg total) by mouth 2 (two) times daily with a meal. 04/10/15   Muthersbaugh, Dahlia ClientHannah, PA-C   naproxen (NAPROSYN) 500 MG tablet Take 1 tablet (500 mg total) by mouth 2 (two) times daily. 03/11/16   Joy, Shawn C, PA-C  oxyCODONE-acetaminophen (PERCOCET/ROXICET) 5-325 MG tablet Take 1-2 tablets by mouth every 6 (six) hours as needed for severe pain. 03/11/16   Joy, Hillard DankerShawn C, PA-C    Family History Family History  Problem Relation Age of Onset  . Asthma Mother     Social History Social History   Tobacco Use  . Smoking status: Current Every Day Smoker    Packs/day: 0.30    Types: Cigarettes  . Smokeless tobacco: Never Used  Substance Use Topics  . Alcohol use: No  . Drug use: No     Allergies   Patient has no known allergies.   Review of Systems Review of Systems  Constitutional: Negative for fatigue and fever.  Eyes: Negative for redness, itching and visual disturbance.  Respiratory: Negative for shortness of breath.   Cardiovascular: Negative for chest pain and leg swelling.  Gastrointestinal: Negative for nausea and vomiting.  Musculoskeletal: Negative for arthralgias and myalgias.  Skin: Positive for wound. Negative for color change and rash.  Neurological: Negative for dizziness, syncope, weakness, light-headedness and headaches.     Physical Exam Triage Vital Signs ED Triage Vitals  Enc Vitals Group     BP 10/22/18 1447 119/80  Pulse Rate 10/22/18 1447 69     Resp 10/22/18 1447 18     Temp 10/22/18 1447 98.2 F (36.8 C)     Temp Source 10/22/18 1447 Oral     SpO2 10/22/18 1447 96 %     Weight --      Height --      Head Circumference --      Peak Flow --      Pain Score 10/22/18 1448 6     Pain Loc --      Pain Edu? --      Excl. in GC? --    No data found.  Updated Vital Signs BP 119/80 (BP Location: Left Arm)   Pulse 69   Temp 98.2 F (36.8 C) (Oral)   Resp 18   SpO2 96%   Visual Acuity Right Eye Distance:   Left Eye Distance:   Bilateral Distance:    Right Eye Near:   Left Eye Near:    Bilateral Near:     Physical Exam  Vitals signs and nursing note reviewed.  Constitutional:      Appearance: He is well-developed.     Comments: No acute distress  HENT:     Head: Normocephalic and atraumatic.     Nose: Nose normal.  Eyes:     Conjunctiva/sclera: Conjunctivae normal.  Neck:     Musculoskeletal: Neck supple.  Cardiovascular:     Rate and Rhythm: Normal rate.  Pulmonary:     Effort: Pulmonary effort is normal. No respiratory distress.  Abdominal:     General: There is no distension.  Musculoskeletal: Normal range of motion.     Comments: Full active range of motion of fingers  Skin:    General: Skin is warm and dry.     Comments: Left palmar surface of hand with repaired wound, sutures intact, minimal surrounding erythema, tenderness to touch, wound edges do still appear detached, no drainage noted  Neurological:     Mental Status: He is alert and oriented to person, place, and time.      UC Treatments / Results  Labs (all labs ordered are listed, but only abnormal results are displayed) Labs Reviewed - No data to display  EKG None  Radiology No results found.  Procedures Procedures (including critical care time)  Medications Ordered in UC Medications - No data to display  Initial Impression / Assessment and Plan / UC Course  I have reviewed the triage vital signs and the nursing notes.  Pertinent labs & imaging results that were available during my care of the patient were reviewed by me and considered in my medical decision making (see chart for details).     Wound without persistent signs of infection, will have continue course of doxycycline, recommending another 5 to 7 days of sutures prior to removal until approximately 10 days after initial repair.  Continue to monitor.Discussed strict return precautions. Patient verbalized understanding and is agreeable with plan.  Final Clinical Impressions(s) / UC Diagnoses   Final diagnoses:  Visit for wound check     Discharge  Instructions     Keep clean and dry  Return in another 5-7 days for removal Finish antibiotic  Follow up for any concerns   ED Prescriptions    None     Controlled Substance Prescriptions [place] Controlled Substance Registry consulted? Not Applicable   Lew Dawes, [place] 10/22/18 1748

## 2019-03-25 ENCOUNTER — Other Ambulatory Visit: Payer: Self-pay

## 2019-03-25 DIAGNOSIS — Z20822 Contact with and (suspected) exposure to covid-19: Secondary | ICD-10-CM

## 2019-03-27 LAB — NOVEL CORONAVIRUS, NAA: SARS-CoV-2, NAA: NOT DETECTED

## 2020-02-01 ENCOUNTER — Other Ambulatory Visit: Payer: Self-pay

## 2020-07-18 ENCOUNTER — Encounter (HOSPITAL_COMMUNITY): Payer: Self-pay

## 2020-08-08 DIAGNOSIS — Z20822 Contact with and (suspected) exposure to covid-19: Secondary | ICD-10-CM | POA: Diagnosis not present

## 2020-12-24 DIAGNOSIS — Z20822 Contact with and (suspected) exposure to covid-19: Secondary | ICD-10-CM | POA: Diagnosis not present

## 2020-12-25 DIAGNOSIS — Z20822 Contact with and (suspected) exposure to covid-19: Secondary | ICD-10-CM | POA: Diagnosis not present

## 2021-07-17 ENCOUNTER — Ambulatory Visit (HOSPITAL_COMMUNITY)
Admission: EM | Admit: 2021-07-17 | Discharge: 2021-07-17 | Disposition: A | Discharge status: Home / Self Care | Payer: Medicare Other | Attending: Family Medicine | Admitting: Family Medicine

## 2021-07-17 ENCOUNTER — Other Ambulatory Visit: Payer: Self-pay

## 2021-07-17 ENCOUNTER — Encounter (HOSPITAL_COMMUNITY): Payer: Self-pay

## 2021-07-17 DIAGNOSIS — L0231 Cutaneous abscess of buttock: Secondary | ICD-10-CM

## 2021-07-17 DIAGNOSIS — R200 Anesthesia of skin: Secondary | ICD-10-CM

## 2021-07-17 DIAGNOSIS — L0291 Cutaneous abscess, unspecified: Secondary | ICD-10-CM

## 2021-07-17 DIAGNOSIS — R61 Generalized hyperhidrosis: Secondary | ICD-10-CM

## 2021-07-17 LAB — CBG MONITORING, ED: Glucose-Capillary: 105 mg/dL — ABNORMAL HIGH (ref 70–99)

## 2021-07-17 MED ORDER — AMOXICILLIN-POT CLAVULANATE 875-125 MG PO TABS: 1.0000 | ORAL_TABLET | Freq: Two times a day (BID) | ORAL | 0 refills | Status: AC

## 2021-07-17 NOTE — Discharge Instructions (Addendum)
Your sugar was 105, which is okay since you have just been drinking Gatorade   Take amoxicillin-clavulanate 875 mg, 1 tab twice daily with food for 7 days.  You can also do warm sitz bath 1-2 times a day

## 2021-07-17 NOTE — ED Triage Notes (Signed)
Pt c/o numbness to feet (intermittent) and bilateral upper extremity fatigue. Patient states hwile eating he has been sweating.  Started about 2 weeks ago.

## 2021-07-17 NOTE — ED Provider Notes (Signed)
[facility]    CSN: [id number] Arrival date & time: 07/17/21  7425      History   Chief Complaint Chief Complaint  Patient presents with   Numbness    HPI Valen Gillison is a 36 y.o. male.   HPI Here for 2-week history of intermittent bilateral leg and foot numbness.  Will last at least a few minutes at a time.  He has maybe had it in his hands once.  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  He also has noted sweating after or with most meals.  He has had some polyuria and polydipsia.  He has gained a little weight.  Recent fever or chills or cold symptoms   He is on disability for attention deficit   He also mentions a history of recurrent abscesses.  In particular he has 1 that has just recently bothered him and drained again on his left buttock at the gluteal crevice.  He also relates possible drug allergy in the past, and states that should be in his chart.  We have no known allergies noted in the chart  Past Medical History:  Diagnosis Date   Asthma     There are no problems to display for this patient.   History reviewed. No pertinent surgical history.     Home Medications    Prior to Admission medications   Medication Sig Start Date End Date Taking? Authorizing Provider  amoxicillin-clavulanate (AUGMENTIN) 875-125 MG tablet Take 1 tablet by mouth 2 (two) times daily for 7 days. 07/17/21 07/24/21 Yes Darien Mignogna, Janace Aris, MD  acetaminophen (TYLENOL) 500 MG tablet Take 1,000 mg by mouth every 6 (six) hours as needed for pain.    [provider]  albuterol (PROVENTIL) (2.5 MG/3ML) 0.083% nebulizer solution Take 2.5 mg by nebulization every 6 (six) hours as needed for wheezing or shortness of breath.    [provider]    Family History Family History  Problem Relation Age of Onset   Asthma Mother     Social History Social History   Tobacco Use   Smoking status:  Every Day    Packs/day: 0.30    Types: Cigarettes   Smokeless tobacco: Never  Substance Use Topics   Alcohol use: No   Drug use: No     Allergies   Patient has no known allergies.   Review of Systems Review of Systems   Physical Exam Triage Vital Signs ED Triage Vitals  Enc Vitals Group     BP 07/17/21 1006 140/86     Pulse Rate 07/17/21 1006 (!) 55     Resp 07/17/21 1006 20     Temp 07/17/21 1006 98.6 F (37 C)     Temp Source 07/17/21 1006 Oral     SpO2 07/17/21 1006 98 %     Weight --      Height --      Head Circumference --      Peak Flow --      Pain Score 07/17/21 1007 0     Pain Loc --      Pain Edu? --      Excl. in GC? --    No data found.  Updated Vital Signs BP 140/86 (BP Location: Right Arm)    Pulse (!) 55    Temp 98.6 F (37 C) (Oral)    Resp 20    SpO2 98%  Visual Acuity Right Eye Distance:   Left Eye Distance:   Bilateral Distance:    Right Eye Near:   Left Eye Near:    Bilateral Near:     Physical Exam Vitals reviewed.  Constitutional:      General: He is not in acute distress.    Appearance: He is not toxic-appearing.  HENT:     Mouth/Throat:     Mouth: Mucous membranes are moist.  Eyes:     Extraocular Movements: Extraocular movements intact.     Pupils: Pupils are equal, round, and reactive to light.  Cardiovascular:     Rate and Rhythm: Normal rate and regular rhythm.     Heart sounds: No murmur heard. Pulmonary:     Effort: Pulmonary effort is normal.     Breath sounds: Normal breath sounds.  Musculoskeletal:        General: No swelling or tenderness.     Cervical back: Neck supple.     Right lower leg: No edema.     Left lower leg: No edema.  Lymphadenopathy:     Cervical: No cervical adenopathy.  Skin:    Coloration: Skin is not jaundiced or pale.     Comments: He has an area of induration with a hole where it is drained to the left of his gluteal crevice about about 6 cm inferior to the top of the gluteal  crevice  Neurological:     General: No focal deficit present.     Mental Status: He is oriented to person, place, and time.  Psychiatric:        Behavior: Behavior normal.     UC Treatments / Results  Labs (all labs ordered are listed, but only abnormal results are displayed) Labs Reviewed  CBG MONITORING, ED - Abnormal; Notable for the following components:      Result Value   Glucose-Capillary 105 (*)    All other components within normal limits    EKG   Radiology No results found.  Procedures Procedures (including critical care time)  Medications Ordered in UC Medications - No data to display  Initial Impression / Assessment and Plan / UC Course  I have reviewed the triage vital signs and the nursing notes.  Pertinent labs & imaging results that were available during my care of the patient were reviewed by me and considered in my medical decision making (see chart for details).     Fingerstick glucose here is 105.  He had been drinking a little Gatorade right before that was done.  Assistance requested to find him a primary care office. Final Clinical Impressions(s) / UC Diagnoses   Final diagnoses:  Numbness  Diaphoresis  Abscess     Discharge Instructions      Your sugar was 105, which is okay since you have just been drinking Gatorade   Take amoxicillin-clavulanate 875 mg, 1 tab twice daily with food for 7 days.  You can also do warm sitz bath 1-2 times a day       ED Prescriptions     Medication Sig Dispense Auth. Provider   amoxicillin-clavulanate (AUGMENTIN) 875-125 MG tablet Take 1 tablet by mouth 2 (two) times daily for 7 days. 14 tablet Jasiah Buntin, Janace Aris, MD      PDMP not reviewed this encounter.   Zenia Resides, MD 07/17/21 1055

## 2021-07-23 DIAGNOSIS — M25512 Pain in left shoulder: Secondary | ICD-10-CM | POA: Diagnosis not present

## 2021-07-23 DIAGNOSIS — M545 Low back pain, unspecified: Secondary | ICD-10-CM | POA: Diagnosis not present

## 2021-07-23 DIAGNOSIS — Z6822 Body mass index (BMI) 22.0-22.9, adult: Secondary | ICD-10-CM | POA: Diagnosis not present

## 2021-07-23 DIAGNOSIS — L74519 Primary focal hyperhidrosis, unspecified: Secondary | ICD-10-CM | POA: Diagnosis not present

## 2021-07-23 DIAGNOSIS — R5383 Other fatigue: Secondary | ICD-10-CM | POA: Diagnosis not present

## 2021-07-23 DIAGNOSIS — Z79899 Other long term (current) drug therapy: Secondary | ICD-10-CM | POA: Diagnosis not present

## 2021-08-01 ENCOUNTER — Other Ambulatory Visit: Payer: Self-pay

## 2021-08-01 ENCOUNTER — Ambulatory Visit (HOSPITAL_COMMUNITY)
Admission: EM | Admit: 2021-08-01 | Discharge: 2021-08-01 | Disposition: A | Discharge status: Home / Self Care | Payer: Medicare Other | Attending: Nurse Practitioner | Admitting: Nurse Practitioner

## 2021-08-01 ENCOUNTER — Encounter (HOSPITAL_COMMUNITY): Payer: Self-pay

## 2021-08-01 DIAGNOSIS — L02411 Cutaneous abscess of right axilla: Secondary | ICD-10-CM

## 2021-08-01 DIAGNOSIS — L02419 Cutaneous abscess of limb, unspecified: Secondary | ICD-10-CM

## 2021-08-01 DIAGNOSIS — R0789 Other chest pain: Secondary | ICD-10-CM | POA: Diagnosis not present

## 2021-08-01 MED ORDER — IBUPROFEN 800 MG PO TABS: 800.0000 mg | ORAL_TABLET | Freq: Three times a day (TID) | ORAL | 0 refills | Status: DC

## 2021-08-01 MED ORDER — AMOXICILLIN-POT CLAVULANATE 875-125 MG PO TABS: 1.0000 | ORAL_TABLET | Freq: Two times a day (BID) | ORAL | 0 refills | Status: AC

## 2021-08-01 NOTE — ED Triage Notes (Signed)
Pt c/o chest pain x 1 day. No other symptoms. ?

## 2021-08-01 NOTE — ED Provider Notes (Signed)
?[facility] ? ? ? ?CSN: [id number] ?Arrival date & time: 08/01/21  0901 ? ? ?  ? ?History   ?Chief Complaint ?Chief Complaint  ?Patient presents with  ? Chest Pain  ? ? ?HPI ?Nathaniel Collier is a 36 y.o. male.  ? ?Patient reports 1 day of right-sided chest pain.  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  He denies shortness of breath, nausea, vomiting, dizziness, palpitations.  The pain is worse when he touches the right side of his chest.  He has not taken anything for the pain. ? ?Patient is also requesting a refill for antibiotics that he was prescribed at last visit.  He reports since last visit, he left the prescription in his friend's car and his friend was pulled over.  The medication was removed from the car.  He reports he has a boil coming up on the right side. ? ? ?Past Medical History:  ?Diagnosis Date  ? Asthma   ? ? ?There are no problems to display for this patient. ? ? ?History reviewed. No pertinent surgical history. ? ? ? ? ?Home Medications   ? ?Prior to Admission medications   ?Medication Sig Start Date End Date Taking? Authorizing Provider  ?amoxicillin-clavulanate (AUGMENTIN) 875-125 MG tablet Take 1 tablet by mouth 2 (two) times daily for 7 days. 08/01/21 08/08/21 Yes Valentino NoseMartinez, Tedd Cottrill A, NP  ?ibuprofen (ADVIL) 800 MG tablet Take 1 tablet (800 mg total) by mouth 3 (three) times daily. Take with food to prevent GI upset 08/01/21  Yes Cathlean MarseillesMartinez, Nikaya Nasby A, NP  ?acetaminophen (TYLENOL) 500 MG tablet Take 1,000 mg by mouth every 6 (six) hours as needed for pain.    [provider]  ?albuterol (PROVENTIL) (2.5 MG/3ML) 0.083% nebulizer solution Take 2.5 mg by nebulization every 6 (six) hours as needed for wheezing or shortness of breath.    [provider]  ? ? ?Family History ?Family History  ?Problem Relation Age of Onset  ? Asthma Mother   ? ? ?Social History ?Social History  ? ?Tobacco Use  ? Smoking status: Every Day  ?   Packs/day: 0.30  ?  Types: Cigarettes  ? Smokeless tobacco: Never  ?Substance Use Topics  ? Alcohol use: No  ? Drug use: No  ? ? ? ?Allergies   ?Patient has no known allergies. ? ? ?Review of Systems ?Review of Systems ?Per HPI ? ?Physical Exam ?Triage Vital Signs ?ED Triage Vitals [08/01/21 1002]  ?Enc Vitals Group  ?   BP 123/73  ?   Pulse Rate 62  ?   Resp   ?   Temp 98.1 ?F (36.7 ?C)  ?   Temp Source Oral  ?   SpO2 100 %  ?   Weight   ?   Height   ?   Head Circumference   ?   Peak Flow   ?   Pain Score   ?   Pain Loc   ?   Pain Edu?   ?   Excl. in GC?   ? ?No data found. ? ?Updated Vital Signs ?BP 123/73 (BP Location: Right Arm)   Pulse 62   Temp 98.1 ?F (36.7 ?C) (Oral)   SpO2 100%  ? ?Visual Acuity ?Right Eye Distance:   ?Left Eye Distance:   ?Bilateral Distance:   ? ?Right Eye Near:   ?Left Eye Near:    ?Bilateral Near:    ? ?Physical Exam ?  Vitals and nursing note reviewed.  ?Constitutional:   ?   General: He is not in acute distress. ?   Appearance: He is well-developed. He is not toxic-appearing.  ?HENT:  ?   Head: Normocephalic and atraumatic.  ?Cardiovascular:  ?   Rate and Rhythm: Normal rate and regular rhythm.  ?   Heart sounds: Normal heart sounds. No murmur heard. ?Pulmonary:  ?   Effort: Pulmonary effort is normal. No accessory muscle usage.  ?   Breath sounds: Normal breath sounds.  ?Chest:  ?   Chest wall: Tenderness (right side under right breast) present.  ?Abdominal:  ?   General: Bowel sounds are normal.  ?   Palpations: Abdomen is soft.  ?Musculoskeletal:     ?   General: Normal range of motion.  ?   Cervical back: Normal range of motion.  ?Lymphadenopathy:  ?   Cervical: No cervical adenopathy.  ?Skin: ?   General: Skin is warm and dry.  ?   Capillary Refill: Capillary refill takes less than 2 seconds.  ?   Coloration: Skin is not cyanotic or pale.  ?   Findings: Abscess present. No erythema.  ?   Comments: There is a firm, hard abscess to right underarm; no active drainage, redness, or  warmth  ?Neurological:  ?   Mental Status: He is alert and oriented to person, place, and time.  ?Psychiatric:     ?   Mood and Affect: Mood normal.     ?   Behavior: Behavior normal.  ? ? ? ?UC Treatments / Results  ?Labs ?(all labs ordered are listed, but only abnormal results are displayed) ?Labs Reviewed - No data to display ? ?EKG ? ? ?Radiology ?No results found. ? ?Procedures ?Procedures (including critical care time) ? ?Medications Ordered in UC ?Medications - No data to display ? ?Initial Impression / Assessment and Plan / UC Course  ?I have reviewed the triage vital signs and the nursing notes. ? ?Pertinent labs & imaging results that were available during my care of the patient were reviewed by me and considered in my medical decision making (see chart for details). ? ?  ?EKG today does not show acute abnormality.  Suspect chest wall pain secondary to musculoskeletal cause.  Encouraged alternating Tylenol and ibuprofen to help with the pain.  He can also use cool and warm compresses to the area.  Offered Toradol injection, however he declines.  Note will be given for work. ? ?Given the recurrent abscesses, will send in another prescription for Augmentin.  Encouraged use of warm compresses to the right underarm area.  Follow up if symptoms persist or worsen despite antibiotic use. ?Final Clinical Impressions(s) / UC Diagnoses  ? ?Final diagnoses:  ?Chest wall pain  ?Axillary abscess  ? ? ? ?Discharge Instructions   ? ?  ?- The EKG today is normal.  ?- The chest wall pain you are having is likely musculoskeletal.  Please start alternating Tylenol and ibuprofen and you can continue light physical activity.  ?- Please resume the Augmentin twice daily for 7 days to help with the abscess.  ?- Follow up if your symptoms persist despite treatment. ? ? ? ? ?ED Prescriptions   ? ? Medication Sig Dispense Auth. Provider  ? amoxicillin-clavulanate (AUGMENTIN) 875-125 MG tablet Take 1 tablet by mouth 2 (two) times  daily for 7 days. 14 tablet Cathlean Marseilles A, NP  ? ibuprofen (ADVIL) 800 MG tablet Take 1 tablet (800 mg total)  by mouth 3 (three) times daily. Take with food to prevent GI upset 21 tablet Valentino Nose, NP  ? ?  ? ?PDMP not reviewed this encounter. ?  ?Valentino Nose, NP ?08/01/21 1045 ? ?

## 2021-08-01 NOTE — Discharge Instructions (Addendum)
-   The EKG today is normal.  ?- The chest wall pain you are having is likely musculoskeletal.  Please start alternating Tylenol and ibuprofen and you can continue light physical activity.  ?- Please resume the Augmentin twice daily for 7 days to help with the abscess.  ?- Follow up if your symptoms persist despite treatment. ?

## 2021-08-08 ENCOUNTER — Telehealth: Payer: Medicare Other | Admitting: Physician Assistant

## 2021-08-08 DIAGNOSIS — A084 Viral intestinal infection, unspecified: Secondary | ICD-10-CM | POA: Diagnosis not present

## 2021-08-08 MED ORDER — ONDANSETRON 4 MG PO TBDP: 4.0000 mg | ORAL_TABLET | Freq: Three times a day (TID) | ORAL | 0 refills | Status: DC | PRN

## 2021-08-08 NOTE — Progress Notes (Signed)
?Virtual Visit Consent  ? ?Nathaniel Collier, you are scheduled for a virtual visit with a [facility] provider today.   ?  ?Just as with appointments in the office, your consent must be obtained to participate.  Your consent will be active for this visit and any virtual visit you may have with one of our providers in the next 365 days.   ?  ?If you have a MyChart account, a copy of this consent can be sent to you electronically.  All virtual visits are billed to your insurance company just like a traditional visit in the office.   ? ?As this is a virtual visit, video technology does not allow for your provider to perform a traditional examination.  This may limit your provider's ability to fully assess your condition.  If your provider identifies any concerns that need to be evaluated in person or the need to arrange testing (such as labs, EKG, etc.), we will make arrangements to do so.   ?  ?Although advances in technology are sophisticated, we cannot ensure that it will always work on either your end or our end.  If the connection with a video visit is poor, the visit may have to be switched to a telephone visit.  With either a video or telephone visit, we are not always able to ensure that we have a secure connection.    ? ?I need to obtain your verbal consent now.   Are you willing to proceed with your visit today?  ?  ?[name] has provided verbal consent on 08/08/2021 for a virtual visit (video or telephone). ?  ?Piedad Climes, PA-C  ? ?Date: 08/08/2021 6:43 PM ? ? ?Virtual Visit via Video Note  ? ?IPiedad Climes, connected with  Kaveh Kissinger Hartt  ([id number], December 24, 1985) on 08/08/21 at  6:45 PM EDT by a video-enabled telemedicine application and verified that I am speaking with the correct person using two identifiers. ? ?Location: ?Patient: Virtual Visit Location Patient: Home ?Provider: Virtual Visit Location Provider: Home Office ?  ?I discussed the limitations of evaluation  and management by telemedicine and the availability of in person appointments. The patient expressed understanding and agreed to proceed.   ? ?History of Present Illness: ?Nathaniel Collier is a 36 y.o. who identifies as a male who was assigned male at birth, and is being seen today for 2.5 days of nausea and vomiting with diarrhea. Notes son with symptoms starting several days ago. He notes symptoms himself Monday night with nausea and vomiting. Followed the next day by lower abdominal pain and diarrhea. Denies hematemesis, melena, hematochezia or tenesmus. Denies fever, aches. Denies recent travel. Notes about 3 episodes of emesis today. Has been staying hydrated. Was able to eat an apple earlier. ? ? ?HPI: HPI  ?Problems:  ?Patient Active Problem List  ? Diagnosis Date Noted  ? 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  ?Allergies: No Known Allergies ?Medications:  ?Current Outpatient Medications:  ?  ondansetron (ZOFRAN-ODT) 4 MG disintegrating tablet, Take 1 tablet (4 mg total) by mouth every 8 (eight) hours as needed for nausea or vomiting., Disp: 20 tablet, Rfl: 0 ?  acetaminophen (TYLENOL) 500 MG tablet, Take 1,000 mg by mouth every 6 (six) hours as needed for pain., Disp: , Rfl:  ?  albuterol (PROVENTIL) (2.5 MG/3ML) 0.083% nebulizer solution, Take 2.5 mg by nebulization every 6 (six) hours as needed for wheezing or shortness of breath., Disp: , Rfl:  ?  amoxicillin-clavulanate (  AUGMENTIN) 875-125 MG tablet, Take 1 tablet by mouth 2 (two) times daily for 7 days., Disp: 14 tablet, Rfl: 0 ?  ibuprofen (ADVIL) 800 MG tablet, Take 1 tablet (800 mg total) by mouth 3 (three) times daily. Take with food to prevent GI upset, Disp: 21 tablet, Rfl: 0 ? ?Observations/Objective: ?Patient is well-developed, well-nourished in no acute distress.  ?Resting comfortably at home.  ?Head is normocephalic, atraumatic.  ?No labored breathing. ?Speech is clear and coherent with logical content.  ?Patient is alert and oriented at  baseline.  ? ?Assessment and Plan: ?1. Viral gastroenteritis ?- ondansetron (ZOFRAN-ODT) 4 MG disintegrating tablet; Take 1 tablet (4 mg total) by mouth every 8 (eight) hours as needed for nausea or vomiting.  Dispense: 20 tablet; Refill: 0 ? ?Supportive measures and OTC medications reviewed. Begin BRAT diet. Zofran per orders. Strict UC/ER precautions given. Work note provided.  ? ?Follow Up Instructions: ?I discussed the assessment and treatment plan with the patient. The patient was provided an opportunity to ask questions and all were answered. The patient agreed with the plan and demonstrated an understanding of the instructions.  A copy of instructions were sent to the patient via MyChart unless otherwise noted below.  ? ?The patient was advised to call back or seek an in-person evaluation if the symptoms worsen or if the condition fails to improve as anticipated. ? ?Time:  ?I spent 15 minutes with the patient via telehealth technology discussing the above problems/concerns.   ? ?Piedad Climes, PA-C ?

## 2021-08-08 NOTE — Patient Instructions (Addendum)
?Kenney Houseman Meckes, thank you for joining Piedad Climes, PA-C for today's virtual visit.  While this provider is not your primary care provider (PCP), if your PCP is located in our provider database this encounter information will be shared with them immediately following your visit. ? ?Consent: ?(Patient) Nathaniel Collier provided verbal consent for this virtual visit at the beginning of the encounter. ? ?Current Medications: ? ?Current Outpatient Medications:  ?  ondansetron (ZOFRAN-ODT) 4 MG disintegrating tablet, Take 1 tablet (4 mg total) by mouth every 8 (eight) hours as needed for nausea or vomiting., Disp: 20 tablet, Rfl: 0 ?  acetaminophen (TYLENOL) 500 MG tablet, Take 1,000 mg by mouth every 6 (six) hours as needed for pain., Disp: , Rfl:  ?  albuterol (PROVENTIL) (2.5 MG/3ML) 0.083% nebulizer solution, Take 2.5 mg by nebulization every 6 (six) hours as needed for wheezing or shortness of breath., Disp: , Rfl:  ?  amoxicillin-clavulanate (AUGMENTIN) 875-125 MG tablet, Take 1 tablet by mouth 2 (two) times daily for 7 days., Disp: 14 tablet, Rfl: 0 ?  ibuprofen (ADVIL) 800 MG tablet, Take 1 tablet (800 mg total) by mouth 3 (three) times daily. Take with food to prevent GI upset, Disp: 21 tablet, Rfl: 0  ? ?Medications ordered in this encounter:  ?Meds ordered this encounter  ?Medications  ? ondansetron (ZOFRAN-ODT) 4 MG disintegrating tablet  ?  Sig: Take 1 tablet (4 mg total) by mouth every 8 (eight) hours as needed for nausea or vomiting.  ?  Dispense:  20 tablet  ?  Refill:  0  ?  Order Specific Question:   Supervising Provider  ?  Answer:   Eber Hong [[id number]]  ?  ? ?*If you need refills on other medications prior to your next appointment, please contact your pharmacy* ? ?Follow-Up: ?Call back or seek an in-person evaluation if the symptoms worsen or if the condition fails to improve as anticipated. ? ?Other Instructions ?Please keep well-hydrated and get plenty of rest. ?Start some  electrolyte water.  ?Use the zofran as directed to help with nausea and vomiting. ?You can use OTC Imodium if needed.  ? ?Start the dietary recommendations below. ?If you become unable to keep fluids in -- you need an ER evaluation ASAP. DO NOT DELAY CARE. ? ?Bland Diet ?A bland diet consists of foods that are often soft and do not have a lot of fat, fiber, or extra seasonings. Foods without fat, fiber, or seasoning are easier for the body to digest. They are also less likely to irritate your mouth, throat, stomach, and other parts of your digestive system. A bland diet is sometimes called a BRAT diet. ?What is my plan? ?Your health care provider or food and nutrition specialist (dietitian) may recommend specific changes to your diet to prevent symptoms or to treat your symptoms. These changes may include: ?Eating small meals often. ?Cooking food until it is soft enough to chew easily. ?Chewing your food well. ?Drinking fluids slowly. ?Not eating foods that are very spicy, sour, or fatty. ?Not eating citrus fruits, such as oranges and grapefruit. ?What do I need to know about this diet? ?Eat a variety of foods from the bland diet food list. ?Do not follow a bland diet longer than needed. ?Ask your health care provider whether you should take vitamins or supplements. ?What foods can I eat? ?Grains ?Hot cereals, such as cream of wheat. Rice. Bread, crackers, or tortillas made from refined white flour. ?Vegetables ?Canned or cooked vegetables.  Mashed or boiled potatoes. ?Fruits ?Bananas. Applesauce. Other types of cooked or canned fruit with the skin and seeds removed, such as canned peaches or pears. ?Meats and other proteins ?Scrambled eggs. Creamy peanut butter or other nut butters. Lean, well-cooked meats, such as chicken or fish. Tofu. Soups or broths. ?Dairy ?Low-fat dairy products, such as milk, cottage cheese, or yogurt. ?Beverages ?Water. Herbal tea. Apple juice. ?Fats and oils ?Mild salad dressings. Canola or  olive oil. ?Sweets and desserts ?Pudding. Custard. Fruit gelatin. Ice cream. ?The items listed above may not be a complete list of recommended foods and beverages. Contact a dietitian for more options. ?What foods are not recommended? ?Grains ?Whole grain breads and cereals. ?Vegetables ?Raw vegetables. ?Fruits ?Raw fruits, especially citrus, berries, or dried fruits. ?Dairy ?Whole fat dairy foods. ?Beverages ?Caffeinated drinks. Alcohol. ?Seasonings and condiments ?Strongly flavored seasonings or condiments. Hot sauce. Salsa. ?Other foods ?Spicy foods. Fried foods. Sour foods, such as pickled or fermented foods. Foods with high sugar content. Foods high in fiber. ?The items listed above may not be a complete list of foods and beverages to avoid. Contact a dietitian for more information. ?Summary ?A bland diet consists of foods that are often soft and do not have a lot of fat, fiber, or extra seasonings. ?Foods without fat, fiber, or seasoning are easier for the body to digest. ?Check with your health care provider to see how long you should follow this diet plan. It is not meant to be followed for long periods. ?This information is not intended to replace advice given to you by your health care provider. Make sure you discuss any questions you have with your health care provider. ?Document Revised: 06/04/2017 Document Reviewed: 06/04/2017 ?Elsevier Patient Education ? 2022 [place]. ? ? ? ?If you have been instructed to have an in-person evaluation today at a local Urgent Care facility, please use the link below. It will take you to a list of all of our available [facility], including address, phone number and hours of operation. Please do not delay care.  ?[facility] ? ?If you or a family member do not have a primary care provider, use the link below to schedule a visit and establish care. When you choose a [facility] primary care physician or advanced practice provider, you gain a  long-term partner in health. ?Find a Primary Care Provider ? ?Learn more about [facility]'s in-office and virtual care options: ?[facility]  ?

## 2021-10-08 ENCOUNTER — Telehealth: Payer: Medicare Other | Admitting: Nurse Practitioner

## 2021-10-08 ENCOUNTER — Encounter: Payer: Medicare Other | Admitting: Family

## 2021-10-08 ENCOUNTER — Telehealth: Payer: Medicare Other | Admitting: Family

## 2021-10-08 DIAGNOSIS — L0291 Cutaneous abscess, unspecified: Secondary | ICD-10-CM | POA: Diagnosis not present

## 2021-10-08 MED ORDER — SULFAMETHOXAZOLE-TRIMETHOPRIM 800-160 MG PO TABS: 1.0000 | ORAL_TABLET | Freq: Two times a day (BID) | ORAL | 0 refills | Status: DC

## 2021-10-08 MED ORDER — HYDROCORTISONE 2.5 % EX OINT: TOPICAL_OINTMENT | Freq: Two times a day (BID) | CUTANEOUS | 0 refills | Status: DC

## 2021-10-08 NOTE — Addendum Note (Signed)
Addended by: Jannifer Rodney A on: 10/08/2021 05:40 PM   Modules accepted: Orders

## 2021-10-08 NOTE — Progress Notes (Signed)
Discussed with patient that we sent bactrim to his pharmacy. Warm compresses.   Jannifer Rodney, FNP

## 2021-10-08 NOTE — Progress Notes (Signed)
E-Visit for Hemorrhoid  We are sorry that you are not feeling well. We are here to help!  Hemorrhoids are swollen veins in the rectum. They can cause itching, bleeding, and pain. Hemorrhoids are very common.  In some cases, you can see or feel hemorrhoids around the outside of the rectum. In other cases, you cannot see them because they are hidden inside the rectum. Be patient - It can take months for this to improve or go away.   Hemorrhoids do not always cause symptoms. But when they do, symptoms can include: ?Itching of the skin around the anus ?Bleeding - Bleeding is usually painless. You might see bright red blood after using the toilet. ?Pain - If a blood clot forms inside a hemorrhoid, this can cause pain. It can also cause a lump that you might be able to feel.   What can I do to keep from getting more hemorrhoids? -- The most important thing you can do is to keep from getting constipated. You should have a bowel movement at least a few times a week. When you have a bowel movement, you also should not have to push too much. Plus, your bowel movements should not be too hard. Being constipated and having hard bowel movements can make hemorrhoids worse.   I have prescribed Topical Hydrocortisone ointment 2.5%.  Apply to area two times per day for 30 days  HOME CARE: Sitz Baths twice daily. Soak buttocks in 2 or 3 inches of warm water for 10 to 15 minutes. Do not add soap, bubble bath, or anything to the water. Stool softener such as Colace 100 mg twice daily AND Miralax 1 scoop daily until you have regular soft stools Over the counter Preparation H Tucks Pads Witch Hazel  Here are some steps you can take to avoid getting constipated or having hard stools:  ?Eat lots of fruits, vegetables, and other foods with fiber. Fiber helps to increase bowel movements. If you do not get enough fiber from your diet, you can take fiber supplements. These come in the form of powders, wafers, or  pills. Some examples are Metamucil, Citrucel, Benefiber and FiberCon. If you take a fiber supplement, be sure to read the label so you know how much to take. If you're not sure, ask your provider or nurse. ?Take medicines called "stool softeners" such as docusate sodium (sample brand names: Colace, Dulcolax). These medicines increase the number of bowel movements you have. They are safe to take and they can prevent problems later.  You should request a referral for a follow up evaluation with a Gastroenterologist (GI doctor) to evaluate this chronic and relapsing condition - even if it improves to see what further steps need to be taken. This is highly linked to chronic constipation and straining to have a bowel movement. It may require further treatment or surgical intervention.   GET HELP RIGHT AWAY IF: You develop severe pain You have heavy bleeding   FOLLOW UP WITH YOUR PRIMARY PROVIDER IF: If your symptoms do not improve within 10 days  MAKE SURE YOU  Understand these instructions. Will watch your condition. Will get help right away if you are not doing well or get worse.   Thank you for choosing an e-visit.  Your e-visit answers were reviewed by a board certified advanced clinical practitioner to complete your personal care plan. Depending upon the condition, your plan could have included both over the counter or prescription medications.  Please review your pharmacy choice. Make   sure the pharmacy is open so you can pick up prescription now. If there is a problem, you may contact your provider through [employer] and have the prescription routed to another pharmacy.  Your safety is important to [place]. If you have drug allergies check your prescription carefully.   For the next 24 hours you can use MyChart to ask questions about today's visit, request a non-urgent call back, or ask for a work or school excuse. You will get an email in the next two days asking about your experience.  I hope that your e-visit has been valuable and will speed your recovery.   Meds ordered this encounter  Medications   hydrocortisone 2.5 % ointment    Sig: Apply topically 2 (two) times daily.    Dispense:  30 g    Refill:  0     I spent approximately 5 minutes reviewing the patient's history, current symptoms and coordinating their plan of care today.

## 2021-10-08 NOTE — Progress Notes (Signed)
See previous message.   Angee Gupton, FNP  

## 2021-10-08 NOTE — Progress Notes (Signed)
E Visit for Cellulitis ° °We are sorry that you are not feeling well. Here is how we plan to help! ° °Based on what you shared with me it looks like you have cellulitis.  Cellulitis looks like areas of skin redness, swelling, and warmth; it develops as a result of bacteria entering under the skin. Little red spots and/or bleeding can be seen in skin, and tiny surface sacs containing fluid can occur. Fever can be present. Cellulitis is almost always on one side of a body, and the lower limbs are the most common site of involvement.  ° °I have prescribed:  Bactrim DS 1 tablet by mouth twice a day for 7 days. ° °HOME CARE: ° °Take your medications as ordered and take all of them, even if the skin irritation appears to be healing.  ° °GET HELP RIGHT AWAY IF: ° °Symptoms that don't begin to go away within 48 hours. °Severe redness persists or worsens °If the area turns color, spreads or swells. °If it blisters and opens, develops yellow-brown crust or bleeds. °You develop a fever or chills. °If the pain increases or becomes unbearable.  °Are unable to keep fluids and food down. ° °MAKE SURE YOU  ° °Understand these instructions. °Will watch your condition. °Will get help right away if you are not doing well or get worse. ° °Thank you for choosing an e-visit. ° °Your e-visit answers were reviewed by a board certified advanced clinical practitioner to complete your personal care plan. Depending upon the condition, your plan could have included both over the counter or prescription medications. ° °Please review your pharmacy choice. Make sure the pharmacy is open so you can pick up prescription now. If there is a problem, you may contact your provider through MyChart messaging and have the prescription routed to another pharmacy.  Your safety is important to us. If you have drug allergies check your prescription carefully.  ° °For the next 24 hours you can use MyChart to ask questions about today's visit, request a  non-urgent call back, or ask for a work or school excuse. °You will get an email in the next two days asking about your experience. I hope that your e-visit has been valuable and will speed your recovery. ° °Approximately 5 minutes was spent documenting and reviewing patient's chart.  ° ° °

## 2021-11-03 ENCOUNTER — Encounter (HOSPITAL_BASED_OUTPATIENT_CLINIC_OR_DEPARTMENT_OTHER): Payer: Self-pay

## 2021-11-03 ENCOUNTER — Emergency Department (HOSPITAL_BASED_OUTPATIENT_CLINIC_OR_DEPARTMENT_OTHER): Payer: Medicare Other | Admitting: Radiology

## 2021-11-03 ENCOUNTER — Other Ambulatory Visit: Payer: Self-pay

## 2021-11-03 ENCOUNTER — Emergency Department (HOSPITAL_BASED_OUTPATIENT_CLINIC_OR_DEPARTMENT_OTHER)
Admission: EM | Admit: 2021-11-03 | Discharge: 2021-11-03 | Disposition: A | Discharge status: Home / Self Care | Payer: Medicare Other | Attending: Emergency Medicine | Admitting: Emergency Medicine

## 2021-11-03 DIAGNOSIS — R0781 Pleurodynia: Secondary | ICD-10-CM | POA: Diagnosis present

## 2021-11-03 DIAGNOSIS — W228XXA Striking against or struck by other objects, initial encounter: Secondary | ICD-10-CM | POA: Diagnosis not present

## 2021-11-03 DIAGNOSIS — S2231XA Fracture of one rib, right side, initial encounter for closed fracture: Secondary | ICD-10-CM | POA: Diagnosis not present

## 2021-11-03 DIAGNOSIS — Y9361 Activity, american tackle football: Secondary | ICD-10-CM | POA: Diagnosis not present

## 2021-11-03 DIAGNOSIS — S299XXA Unspecified injury of thorax, initial encounter: Secondary | ICD-10-CM | POA: Diagnosis present

## 2021-11-03 MED ORDER — IBUPROFEN 800 MG PO TABS: 800.0000 mg | ORAL_TABLET | Freq: Three times a day (TID) | ORAL | 0 refills | Status: DC

## 2021-11-03 MED ORDER — IBUPROFEN 800 MG PO TABS
ORAL_TABLET | ORAL | Status: AC
  Filled 2021-11-03: qty 1

## 2021-11-03 NOTE — ED Triage Notes (Signed)
Patient states about 1 week ago he was playing football and got hit on the right rib cage causing pain. Pain has not gotten worse. OTC meds not helping.

## 2021-11-03 NOTE — ED Provider Notes (Signed)
[facility] EMERGENCY DEPT Provider Note   CSN: [id number] Arrival date & time: 11/03/21  1336     History  Chief Complaint  Patient presents with   Rib Injury    Nathaniel Collier is a 36 y.o. male who presents to the ED with concerns for right rib pain onset 1 week ago.  Patient notes that he was playing football when he was tackled and had resulting pain to his right lower rib.  Has tried ibuprofen at home with no relief of symptoms.  Denies chest pain, shortness of breath, hitting his head, LOC, nausea, vomiting.   The history is provided by the patient. No language interpreter was used.       Home Medications Prior to Admission medications   Medication Sig Start Date End Date Taking? Authorizing Provider  acetaminophen (TYLENOL) 500 MG tablet Take 1,000 mg by mouth every 6 (six) hours as needed for pain.    [provider]  albuterol (PROVENTIL) (2.5 MG/3ML) 0.083% nebulizer solution Take 2.5 mg by nebulization every 6 (six) hours as needed for wheezing or shortness of breath.    [provider]  hydrocortisone 2.5 % ointment Apply topically 2 (two) times daily. 10/08/21   Viviano Simas, FNP  ibuprofen (ADVIL) 800 MG tablet Take 1 tablet (800 mg total) by mouth 3 (three) times daily. Take with food to prevent GI upset 08/01/21   Valentino Nose, NP  ondansetron (ZOFRAN-ODT) 4 MG disintegrating tablet Take 1 tablet (4 mg total) by mouth every 8 (eight) hours as needed for nausea or vomiting. 08/08/21   Waldon Merl, PA-C  sulfamethoxazole-trimethoprim (BACTRIM DS) 800-160 MG tablet Take 1 tablet by mouth 2 (two) times daily. 10/08/21   Junie Spencer, FNP      Allergies    Patient has no known allergies.    Review of Systems   Review of Systems  Respiratory:  Negative for stridor.   Cardiovascular:  Negative for chest pain.  Gastrointestinal:  Negative for nausea and vomiting.  Musculoskeletal:  Positive for arthralgias. Negative  for joint swelling.  All other systems reviewed and are negative.   Physical Exam Updated Vital Signs BP 118/77 (BP Location: Left Arm)   Pulse 64   Temp 98.3 F (36.8 C)   Resp 16   Ht 6\' 2"  (1.88 m)   Wt 77.1 kg   SpO2 100%   BMI 21.83 kg/m  Physical Exam Vitals and nursing note reviewed.  Constitutional:      General: He is not in acute distress.    Appearance: He is not diaphoretic.  HENT:     Head: Normocephalic and atraumatic.     Mouth/Throat:     Pharynx: No oropharyngeal exudate.  Eyes:     General: No scleral icterus.    Conjunctiva/sclera: Conjunctivae normal.  Cardiovascular:     Rate and Rhythm: Normal rate and regular rhythm.     Pulses: Normal pulses.     Heart sounds: Normal heart sounds.  Pulmonary:     Effort: Pulmonary effort is normal. No respiratory distress.     Breath sounds: Normal breath sounds. No wheezing.  Chest:     Chest wall: Tenderness present.     Comments: Tenderness to palpation noted to right lower ribs without obvious deformity, step-off.  No overlying skin changes noted. Abdominal:     General: Bowel sounds are normal.     Palpations: Abdomen is soft. There is no mass.     Tenderness:  There is no abdominal tenderness. There is no guarding or rebound.  Musculoskeletal:        General: Normal range of motion.     Cervical back: Normal range of motion and neck supple.  Skin:    General: Skin is warm and dry.  Neurological:     Mental Status: He is alert.  Psychiatric:        Behavior: Behavior normal.     ED Results / Procedures / Treatments   Labs (all labs ordered are listed, but only abnormal results are displayed) Labs Reviewed - No data to display  EKG None  Radiology DG Ribs Unilateral W/Chest Right  Result Date: 11/03/2021 CLINICAL DATA:  Right rib injury 1 week ago while playing football. Right rib pain. EXAM: RIGHT RIBS AND CHEST - 3+ VIEW COMPARISON:  None Available. FINDINGS: Nondisplaced fracture is seen  involving the right anterior 10th rib. No other rib fractures are identified. There is no evidence of pneumothorax or pleural effusion. Both lungs are clear. Heart size and mediastinal contours are within normal limits. IMPRESSION: Nondisplaced fracture of right anterior 10th rib. No active cardiopulmonary disease. Electronically Signed   By: Danae Orleans M.D.   On: 11/03/2021 15:37    Procedures Procedures    Medications Ordered in ED Medications - No data to display  ED Course/ Medical Decision Making/ A&P                            Medical Decision Making Amount and/or Complexity of Data Reviewed Radiology: ordered.   Pt presents with right rib injury onset 1 week after playing football being hit on the right side.  Has tried ibuprofen with no relief of symptoms at home.  Denies hitting his head, LOC, vomiting.  No chest pain or shortness of breath.  Vital signs stable, patient afebrile. On exam, pt with Tenderness to palpation noted to right lower ribs without obvious deformity, step-off.  No overlying skin changes noted. No acute cardiovascular, respiratory, exam findings. Differential diagnosis includes fracture, dislocation, contusion, PTX, PNA.    Additional history obtained:  Additional history obtained from Spouse/Significant Other   Imaging: I ordered imaging studies including right ribs unilateral with chest x-ray I independently visualized and interpreted imaging which showed: Nondisplaced fracture of right 10th rib I agree with the radiologist interpretation   Disposition: Presentation suspicious for fracture.  Doubt dislocation or contusion at this time.  Doubt pneumonia or PTX at this time. After consideration of the diagnostic results and the patients response to treatment, I feel that the patient would benefit from Discharge home.  Patient will be provided with an incentive spirometer in the ED prior to discharge.  Work note provided.  Supportive care measures and  strict return precautions discussed with patient at bedside. Pt acknowledges and verbalizes understanding. Pt appears safe for discharge. Follow up as indicated in discharge paperwork.    This chart was dictated using voice recognition software, Dragon. Despite the best efforts of this provider to proofread and correct errors, errors may still occur which can change documentation meaning.  Final Clinical Impression(s) / ED Diagnoses Final diagnoses:  Closed fracture of one rib of right side, initial encounter    Rx / DC Orders ED Discharge Orders          Ordered    ibuprofen (ADVIL) 800 MG tablet  3 times daily        11/03/21 1740  Laporshia Hogen A, PA-C 11/03/21 1743    Vanetta Mulders, MD 11/17/21 [phone]

## 2021-11-03 NOTE — Discharge Instructions (Signed)
It was a pleasure taking care of you today!   Your xray showed a fracture of your right lower rib. You will be sent home with incentive spirometer, use as directed.  You may take over-the-counter milligrams Tylenol every 6 hours or 800 mg ibuprofen every 6 hours as needed for pain for no more than 7 days.  You may place ice to the affected area for 15 minutes at a time.  Sure to place a barrier between your skin and the ice.  May follow-up with your primary care provider as needed.  Return to the emergency department for experiencing increasing/worsening trouble breathing, pain in the chest, worsening symptoms.

## 2021-11-03 NOTE — ED Notes (Signed)
Dc instructions reviewed with patient. Patient voiced understanding. Dc with belongings.  °

## 2021-11-13 ENCOUNTER — Ambulatory Visit (INDEPENDENT_AMBULATORY_CARE_PROVIDER_SITE_OTHER): Payer: Medicare Other | Admitting: Family Medicine

## 2021-11-13 ENCOUNTER — Encounter: Payer: Self-pay | Admitting: Family Medicine

## 2021-11-13 VITALS — BP 116/72 | Ht 74.0 in | Wt 170.0 lb

## 2021-11-13 DIAGNOSIS — S2231XA Fracture of one rib, right side, initial encounter for closed fracture: Secondary | ICD-10-CM

## 2021-11-13 NOTE — Progress Notes (Signed)
PCP: Pcp, No  Subjective:   HPI: Patient is a 36 y.o. male here for R rib pain.  Patient presents for follow up of R 10th rib fracture. Fracture occurred 3 weeks ago playing football with his cousin, was struck and lost balance falling into some stairs and striking his right ribs. Fracture confirmed in the ED with significant TTP along the 10th rib and pain with inspiration. Pain has significantly improved over the last 3 weeks and NSAID use has decreased to 1 pill of ibuprofen daily if needed. He is back to weight lifting and doing so for the past 3 days without issues. Denies anymore pain with deep inspiration. 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 He has been doing well with his IS at home.   Past Medical History:  Diagnosis Date   Asthma     Current Outpatient Medications on File Prior to Visit  Medication Sig Dispense Refill   acetaminophen (TYLENOL) 500 MG tablet Take 1,000 mg by mouth every 6 (six) hours as needed for pain.     albuterol (PROVENTIL) (2.5 MG/3ML) 0.083% nebulizer solution Take 2.5 mg by nebulization every 6 (six) hours as needed for wheezing or shortness of breath.     hydrocortisone 2.5 % ointment Apply topically 2 (two) times daily. 30 g 0   ibuprofen (ADVIL) 800 MG tablet Take 1 tablet (800 mg total) by mouth 3 (three) times daily. Take with food to prevent GI upset 21 tablet 0   ondansetron (ZOFRAN-ODT) 4 MG disintegrating tablet Take 1 tablet (4 mg total) by mouth every 8 (eight) hours as needed for nausea or vomiting. 20 tablet 0   sulfamethoxazole-trimethoprim (BACTRIM DS) 800-160 MG tablet Take 1 tablet by mouth 2 (two) times daily. 14 tablet 0   No current facility-administered medications on file prior to visit.    No past surgical history on file.  No Known Allergies  BP 116/72 (BP Location: Right Arm, Patient Position: Sitting, Cuff Size: Normal)   Ht 6\' 2"  (1.88 m)    Wt 170 lb (77.1 kg)   BMI 21.83 kg/m       No data to display              No data to display              Objective:  Physical Exam:  Gen: NAD, comfortable in exam room  Right thorax Inspection: No ecchymosis, erythema, lesions, or soft tissue swelling Palpation: Mild TTP along the 10th R rib. No crepitus, edema, or effusion. No vertebral TTP. No paraspinal TTP. ROM: Full ROM of back with extension, flexion, and side bends.  Neurovasc: NV intact   Assessment & Plan:  1. R 10th Rib Fracture - He is improving appropriately, anticipate continued improvement over the next 3-5 weeks - Continue NSAIDs as needed for any discomfort - Can return to work full capacity, will provide work note today - Continue incentive spirometer for additional 3 weeks - Follow up for worsening or unrelenting pain or as needed   Chyrel Masson, MD FM PGY-2  Patient seen and examined with resident.  Agree with his note and findings.  Note edited above.

## 2021-12-05 ENCOUNTER — Other Ambulatory Visit: Payer: Self-pay | Admitting: Nurse Practitioner

## 2021-12-05 DIAGNOSIS — L0291 Cutaneous abscess, unspecified: Secondary | ICD-10-CM

## 2021-12-31 ENCOUNTER — Telehealth: Payer: Medicaid Other | Admitting: Family Medicine

## 2021-12-31 ENCOUNTER — Telehealth: Payer: Medicaid Other

## 2021-12-31 NOTE — Progress Notes (Signed)
The patient no-showed for appointment despite this provider sending direct link, reaching out via phone with no response and waiting for at least 10 minutes from appointment time for patient to join. They will be marked as a NS for this appointment/time.   Keneth Borg M Trystan Akhtar, NP    

## 2022-04-01 DIAGNOSIS — F331 Major depressive disorder, recurrent, moderate: Secondary | ICD-10-CM | POA: Diagnosis not present

## 2022-04-01 DIAGNOSIS — F411 Generalized anxiety disorder: Secondary | ICD-10-CM | POA: Diagnosis not present

## 2022-06-07 ENCOUNTER — Telehealth: Payer: Medicare Other | Admitting: Family Medicine

## 2022-06-07 DIAGNOSIS — L02411 Cutaneous abscess of right axilla: Secondary | ICD-10-CM | POA: Diagnosis not present

## 2022-06-07 MED ORDER — SULFAMETHOXAZOLE-TRIMETHOPRIM 800-160 MG PO TABS: 1.0000 | ORAL_TABLET | Freq: Two times a day (BID) | ORAL | 0 refills | Status: DC

## 2022-06-07 NOTE — Patient Instructions (Signed)
Skin Abscess  A skin abscess is an infected area on or under your skin that contains a collection of pus and other material. An abscess may also be called a furuncle, carbuncle, or boil. An abscess can occur in or on almost any part of your body. Some abscesses break open (rupture) on their own. Most continue to get worse unless they are treated. The infection can spread deeper into the body and eventually into your blood, which can make you feel ill. Treatment usually involves draining the abscess. What are the causes? An abscess occurs when germs, like bacteria, pass through your skin and cause an infection. This may be caused by: A scrape or cut on your skin. A puncture wound through your skin, including a needle injection or insect bite. Blocked oil or sweat glands. Blocked and infected hair follicles. A cyst that forms beneath your skin (sebaceous cyst) and becomes infected. What increases the risk? This condition is more likely to develop in people who: Have a weak body defense system (immune system). Have diabetes. Have dry and irritated skin. Get frequent injections or use illegal IV drugs. Have a foreign body in a wound, such as a splinter. Have problems with their lymph system or veins. What are the signs or symptoms? Symptoms of this condition include: A painful, firm bump under the skin. A bump with pus at the top. This may break through the skin and drain. Other symptoms include: Redness surrounding the abscess site. Warmth. Swelling of the lymph nodes (glands) near the abscess. Tenderness. A sore on the skin. How is this diagnosed? This condition may be diagnosed based on: A physical exam. Your medical history. A sample of pus. This may be used to find out what is causing the infection. Blood tests. Imaging tests, such as an ultrasound, CT scan, or MRI. How is this treated? A small abscess that drains on its own may not need treatment. Treatment for larger abscesses  may include: Moist heat or heat pack applied to the area several times a day. A procedure to drain the abscess (incision and drainage). Antibiotic medicines. For a severe abscess, you may first get antibiotics through an IV and then change to antibiotics by mouth. Follow these instructions at home: Medicines  Take over-the-counter and prescription medicines only as told by your health care provider. If you were prescribed an antibiotic medicine, take it as told by your health care provider. Do not stop taking the antibiotic even if you start to feel better. Abscess care  If you have an abscess that has not drained, apply heat to the affected area. Use the heat source that your health care provider recommends, such as a moist heat pack or a heating pad. Place a towel between your skin and the heat source. Leave the heat on for 20-30 minutes. Remove the heat if your skin turns bright red. This is especially important if you are unable to feel pain, heat, or cold. You may have a greater risk of getting burned. Follow instructions from your health care provider about how to take care of your abscess. Make sure you: Cover the abscess with a bandage (dressing). Change your dressing or gauze as told by your health care provider. Wash your hands with soap and water before you change the dressing or gauze. If soap and water are not available, use hand sanitizer. Check your abscess every day for signs of a worsening infection. Check for: More redness, swelling, or pain. More fluid or blood. Warmth.   More pus or a bad smell. General instructions To avoid spreading the infection: Do not share personal care items, towels, or hot tubs with others. Avoid making skin contact with other people. Keep all follow-up visits as told by your health care provider. This is important. Contact a health care provider if you have: More redness, swelling, or pain around your abscess. More fluid or blood coming from  your abscess. Warm skin around your abscess. More pus or a bad smell coming from your abscess. Muscle aches. Chills or a general ill feeling. Get help right away if you: Have severe pain. See red streaks on your skin spreading away from the abscess. See redness that spreads quickly. Have a fever or chills. Summary A skin abscess is an infected area on or under your skin that contains a collection of pus and other material. A small abscess that drains on its own may not need treatment. Treatment for larger abscesses may include having a procedure to drain the abscess and taking an antibiotic. This information is not intended to replace advice given to you by your health care provider. Make sure you discuss any questions you have with your health care provider. Document Revised: 08/09/2021 Document Reviewed: 02/12/2021 Elsevier Patient Education  2023 [employer] Inc[employer]  

## 2022-06-07 NOTE — Progress Notes (Signed)
Virtual Visit Consent   Nathaniel Collier, you are scheduled for a virtual visit with a [facility] provider today. Just as with appointments in the office, your consent must be obtained to participate. Your consent will be active for this visit and any virtual visit you may have with one of our providers in the next 365 days. If you have a MyChart account, a copy of this consent can be sent to you electronically.  As this is a virtual visit, video technology does not allow for your provider to perform a traditional examination. This may limit your provider's ability to fully assess your condition. If your provider identifies any concerns that need to be evaluated in person or the need to arrange testing (such as labs, EKG, etc.), we will make arrangements to do so. Although advances in technology are sophisticated, we cannot ensure that it will always work on either your end or our end. If the connection with a video visit is poor, the visit may have to be switched to a telephone visit. With either a video or telephone visit, we are not always able to ensure that we have a secure connection.  By engaging in this virtual visit, you consent to the provision of healthcare and authorize for your insurance to be billed (if applicable) for the services provided during this visit. Depending on your insurance coverage, you may receive a charge related to this service.  I need to obtain your verbal consent now. Are you willing to proceed with your visit today? Nathaniel Collier has provided verbal consent on 06/07/2022 for a virtual visit (video or telephone). Dellia Nims, FNP  Date: 06/07/2022 2:27 PM  Virtual Visit via Video Note   I, Dellia Nims, connected with  Nathaniel Collier  ([id number], 07-05-1958) on 06/07/22 at  2:30 PM EST by a video-enabled telemedicine application and verified that I am speaking with the correct person using two identifiers.  Location: Patient: Virtual Visit Location  Patient: Home Provider: Virtual Visit Location Provider: Home Office   I discussed the limitations of evaluation and management by telemedicine and the availability of in person appointments. The patient expressed understanding and agreed to proceed.    History of Present Illness: Nathaniel Collier is a 37 y.o. who identifies as a male who was assigned male at birth, and is being seen today for an abscess in rt axilla. He placed a boil pad in it night before last and woke to it open and draining. Large amt of pus noted. Area is now red and sore. He says he had one last year and took bactrim which allowed it to clear. No fever. 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    Allergies: No Known Allergies Medications:  Current Outpatient Medications:    sulfamethoxazole-trimethoprim (BACTRIM DS) 800-160 MG tablet, Take 1 tablet by mouth 2 (two) times daily for 10 days., Disp: 20 tablet, Rfl: 0   acetaminophen (TYLENOL) 500 MG tablet, Take 1,000 mg by mouth every 6 (six) hours as needed for pain., Disp: , Rfl:    albuterol (PROVENTIL) (2.5 MG/3ML) 0.083% nebulizer solution, Take 2.5 mg by nebulization every 6 (six) hours as needed for wheezing or shortness of breath., Disp: , Rfl:    hydrocortisone 2.5 % ointment, Apply topically 2 (two) times daily., Disp: 30 g, Rfl: 0   ibuprofen (ADVIL) 800 MG tablet, Take 1 tablet (800 mg total) by  mouth 3 (three) times daily. Take with food to prevent GI upset, Disp: 21 tablet, Rfl: 0   ondansetron (ZOFRAN-ODT) 4 MG disintegrating tablet, Take 1 tablet (4 mg total) by mouth every 8 (eight) hours as needed for nausea or vomiting., Disp: 20 tablet, Rfl: 0   sulfamethoxazole-trimethoprim (BACTRIM DS) 800-160 MG tablet, Take 1 tablet by mouth 2 (two) times daily., Disp: 14 tablet, Rfl: 0  Observations/Objective: Patient is well-developed, well-nourished in no acute distress.  Resting  comfortably  at home.  Head is normocephalic, atraumatic.  No labored breathing.  Speech is clear and coherent with logical content.  Patient is alert and oriented at baseline.  Area of erythema with a hole note din rt axilla  Assessment and Plan: 1. Abscess of right axilla  Heat, clean BID with peroxide, urgent care if sx worsen. Discussed going when not on antibiotics and its draining for a culture. He expresses wanting to find out if it is mrsa in the future.   Follow Up Instructions: I discussed the assessment and treatment plan with the patient. The patient was provided an opportunity to ask questions and all were answered. The patient agreed with the plan and demonstrated an understanding of the instructions.  A copy of instructions were sent to the patient via MyChart unless otherwise noted below.     The patient was advised to call back or seek an in-person evaluation if the symptoms worsen or if the condition fails to improve as anticipated.  Time:  I spent 8 minutes with the patient via telehealth technology discussing the above problems/concerns.    Dellia Nims, FNP

## 2022-06-12 ENCOUNTER — Emergency Department (HOSPITAL_COMMUNITY): Payer: Medicare Other

## 2022-06-12 ENCOUNTER — Emergency Department (HOSPITAL_COMMUNITY)
Admission: EM | Admit: 2022-06-12 | Discharge: 2022-06-12 | Disposition: A | Discharge status: Home / Self Care | Payer: Medicare Other | Attending: Emergency Medicine | Admitting: Emergency Medicine

## 2022-06-12 ENCOUNTER — Other Ambulatory Visit: Payer: Self-pay

## 2022-06-12 DIAGNOSIS — R079 Chest pain, unspecified: Secondary | ICD-10-CM | POA: Diagnosis not present

## 2022-06-12 DIAGNOSIS — R42 Dizziness and giddiness: Secondary | ICD-10-CM | POA: Diagnosis not present

## 2022-06-12 DIAGNOSIS — T7840XA Allergy, unspecified, initial encounter: Secondary | ICD-10-CM | POA: Diagnosis not present

## 2022-06-12 DIAGNOSIS — L509 Urticaria, unspecified: Secondary | ICD-10-CM | POA: Diagnosis not present

## 2022-06-12 DIAGNOSIS — R1013 Epigastric pain: Secondary | ICD-10-CM | POA: Insufficient documentation

## 2022-06-12 DIAGNOSIS — R112 Nausea with vomiting, unspecified: Secondary | ICD-10-CM | POA: Insufficient documentation

## 2022-06-12 DIAGNOSIS — R109 Unspecified abdominal pain: Secondary | ICD-10-CM | POA: Diagnosis not present

## 2022-06-12 DIAGNOSIS — R55 Syncope and collapse: Secondary | ICD-10-CM | POA: Diagnosis not present

## 2022-06-12 DIAGNOSIS — R0789 Other chest pain: Secondary | ICD-10-CM | POA: Diagnosis not present

## 2022-06-12 LAB — COMPREHENSIVE METABOLIC PANEL
ALT: 14 U/L (ref 0–44)
AST: 25 U/L (ref 15–41)
Albumin: 3.7 g/dL (ref 3.5–5.0)
Alkaline Phosphatase: 85 U/L (ref 38–126)
Anion gap: 10 (ref 5–15)
BUN: 8 mg/dL (ref 6–20)
CO2: 22 mmol/L (ref 22–32)
Calcium: 8.8 mg/dL — ABNORMAL LOW (ref 8.9–10.3)
Chloride: 102 mmol/L (ref 98–111)
Creatinine, Ser: 1.18 mg/dL (ref 0.61–1.24)
GFR, Estimated: >60 mL/min (ref >60)
Glucose, Bld: 90 mg/dL (ref 70–99)
Potassium: 4 mmol/L (ref 3.5–5.1)
Sodium: 134 mmol/L — ABNORMAL LOW (ref 135–145)
Total Bilirubin: 0.9 mg/dL (ref 0.3–1.2)
Total Protein: 7.1 g/dL (ref 6.5–8.1)

## 2022-06-12 LAB — CBC WITH DIFFERENTIAL/PLATELET
Abs Immature Granulocytes: 0.06 10*3/uL (ref 0.00–0.07)
Basophils Absolute: 0 10*3/uL (ref 0.0–0.1)
Basophils Relative: 0 %
Eosinophils Absolute: 0 10*3/uL (ref 0.0–0.5)
Eosinophils Relative: 0 %
HCT: 41.8 % (ref 39.0–52.0)
Hemoglobin: 14.7 g/dL (ref 13.0–17.0)
Immature Granulocytes: 1 %
Lymphocytes Relative: 20 %
Lymphs Abs: 2.7 10*3/uL (ref 0.7–4.0)
MCH: 34.2 pg — ABNORMAL HIGH (ref 26.0–34.0)
MCHC: 35.2 g/dL (ref 30.0–36.0)
MCV: 97.2 fL (ref 80.0–100.0)
Monocytes Absolute: 1 10*3/uL (ref 0.1–1.0)
Monocytes Relative: 7 %
Neutro Abs: 9.5 10*3/uL — ABNORMAL HIGH (ref 1.7–7.7)
Neutrophils Relative %: 72 %
Platelets: 301 10*3/uL (ref 150–400)
RBC: 4.3 MIL/uL (ref 4.22–5.81)
RDW: 13.5 % (ref 11.5–15.5)
WBC: 13.3 10*3/uL — ABNORMAL HIGH (ref 4.0–10.5)
nRBC: 0 % (ref 0.0–0.2)

## 2022-06-12 LAB — LIPASE, BLOOD: Lipase: 31 U/L (ref 11–51)

## 2022-06-12 LAB — TROPONIN I (HIGH SENSITIVITY)
Troponin I (High Sensitivity): 5 ng/L (ref <18)
Troponin I (High Sensitivity): 4 ng/L (ref <18)

## 2022-06-12 MED ORDER — DICYCLOMINE HCL 10 MG PO CAPS
10.0000 mg | ORAL_CAPSULE | Freq: Once | ORAL | Status: AC
  Administered 2022-06-12: 10 mg via ORAL
  Filled 2022-06-12: qty 1

## 2022-06-12 MED ORDER — LACTATED RINGERS IV BOLUS
1000.0000 mL | Freq: Once | INTRAVENOUS | Status: AC
  Administered 2022-06-12: 1000 mL via INTRAVENOUS

## 2022-06-12 MED ORDER — DIPHENHYDRAMINE HCL 50 MG/ML IJ SOLN
25.0000 mg | Freq: Once | INTRAMUSCULAR | Status: AC
  Administered 2022-06-12: 25 mg via INTRAVENOUS
  Filled 2022-06-12: qty 1

## 2022-06-12 MED ORDER — FAMOTIDINE IN NACL 20-0.9 MG/50ML-% IV SOLN
20.0000 mg | Freq: Once | INTRAVENOUS | Status: AC
  Administered 2022-06-12: 20 mg via INTRAVENOUS
  Filled 2022-06-12: qty 50

## 2022-06-12 MED ORDER — IOHEXOL 350 MG/ML SOLN
75.0000 mL | Freq: Once | INTRAVENOUS | Status: AC | PRN
  Administered 2022-06-12: 75 mL via INTRAVENOUS

## 2022-06-12 MED ORDER — ALUM & MAG HYDROXIDE-SIMETH 200-200-20 MG/5ML PO SUSP
30.0000 mL | Freq: Once | ORAL | Status: AC
  Administered 2022-06-12: 30 mL via ORAL
  Filled 2022-06-12: qty 30

## 2022-06-12 MED ORDER — LACTATED RINGERS IV SOLN: INTRAVENOUS | Status: DC

## 2022-06-12 MED ORDER — METHYLPREDNISOLONE SODIUM SUCC 125 MG IJ SOLR
125.0000 mg | Freq: Once | INTRAMUSCULAR | Status: AC
  Administered 2022-06-12: 125 mg via INTRAVENOUS
  Filled 2022-06-12: qty 2

## 2022-06-12 NOTE — ED Provider Notes (Signed)
[facility] Provider Note   CSN: [id number] Arrival date & time: 06/12/22  1550     History  No chief complaint on file.   Nathaniel Collier is a 37 y.o. male.  37 year old male presents with epigastric abdominal pain rating to his chest.  Patient states that yesterday he drank 2 shots of liquor and then afterwards had hives.  Was given Benadryl and went to bed.  Awoke this morning with epigastric pain going to his chest.  Pain is characterized as worse with deep breathing.  Has had nausea with emesis x 1.  No black or bloody stools.  Emesis was nonbilious or bloody.  No fever or chills.  No cough.  Called EMS and was transported here       Home Medications Prior to Admission medications   Medication Sig Start Date End Date Taking? Authorizing Provider  acetaminophen (TYLENOL) 500 MG tablet Take 1,000 mg by mouth every 6 (six) hours as needed for pain.    [provider]  albuterol (PROVENTIL) (2.5 MG/3ML) 0.083% nebulizer solution Take 2.5 mg by nebulization every 6 (six) hours as needed for wheezing or shortness of breath.    [provider]  hydrocortisone 2.5 % ointment Apply topically 2 (two) times daily. 10/08/21   Apolonio Schneiders, FNP  ibuprofen (ADVIL) 800 MG tablet Take 1 tablet (800 mg total) by mouth 3 (three) times daily. Take with food to prevent GI upset 11/03/21   Blue, Soijett A, PA-C  ondansetron (ZOFRAN-ODT) 4 MG disintegrating tablet Take 1 tablet (4 mg total) by mouth every 8 (eight) hours as needed for nausea or vomiting. 08/08/21   Brunetta Jeans, PA-C  sulfamethoxazole-trimethoprim (BACTRIM DS) 800-160 MG tablet Take 1 tablet by mouth 2 (two) times daily. 10/08/21   Sharion Balloon, FNP  sulfamethoxazole-trimethoprim (BACTRIM DS) 800-160 MG tablet Take 1 tablet by mouth 2 (two) times daily for 10 days. 06/07/22 06/17/22  Tempie Hoist, FNP      Allergies    Patient has no known allergies.     Review of Systems   Review of Systems  All other systems reviewed and are negative.   Physical Exam Updated Vital Signs BP 134/82 (BP Location: Left Arm)   Pulse 73   Temp 98.5 F (36.9 C) (Oral)   Resp 12   SpO2 100%  Physical Exam Vitals and nursing note reviewed.  Constitutional:      General: He is not in acute distress.    Appearance: Normal appearance. He is well-developed. He is not toxic-appearing.  HENT:     Head: Normocephalic and atraumatic.  Eyes:     General: Lids are normal.     Conjunctiva/sclera: Conjunctivae normal.     Pupils: Pupils are equal, round, and reactive to light.  Neck:     Thyroid: No thyroid mass.     Trachea: No tracheal deviation.  Cardiovascular:     Rate and Rhythm: Normal rate and regular rhythm.     Heart sounds: Normal heart sounds. No murmur heard.    No gallop.  Pulmonary:     Effort: Pulmonary effort is normal. No respiratory distress.     Breath sounds: Normal breath sounds. No stridor. No decreased breath sounds, wheezing, rhonchi or rales.  Abdominal:     General: There is no distension.     Palpations: Abdomen is soft.     Tenderness: There is abdominal tenderness in the epigastric area. There is  guarding. There is no rebound.    Musculoskeletal:        General: No tenderness. Normal range of motion.     Cervical back: Normal range of motion and neck supple.  Skin:    General: Skin is warm and dry.     Findings: No abrasion or rash.  Neurological:     Mental Status: He is alert and oriented to person, place, and time. Mental status is at baseline.     GCS: GCS eye subscore is 4. GCS verbal subscore is 5. GCS motor subscore is 6.     Cranial Nerves: No cranial nerve deficit.     Sensory: No sensory deficit.     Motor: Motor function is intact.  Psychiatric:        Attention and Perception: Attention normal.        Speech: Speech normal.        Behavior: Behavior normal.     ED Results / Procedures / Treatments    Labs (all labs ordered are listed, but only abnormal results are displayed) Labs Reviewed  CBC WITH DIFFERENTIAL/PLATELET  COMPREHENSIVE METABOLIC PANEL  LIPASE, BLOOD  TROPONIN I (HIGH SENSITIVITY)    EKG EKG Interpretation  Date/Time:  Wednesday June 12 2022 17:05:42 EST Ventricular Rate:  57 PR Interval:  142 QRS Duration: 98 QT Interval:  420 QTC Calculation: 408 R Axis:   80 Text Interpretation: Sinus bradycardia Early repolarization Otherwise normal ECG No previous ECGs available Confirmed by Lacretia Leigh ([id number]) on 06/12/2022 6:23:06 PM  Radiology No results found.  Procedures Procedures    Medications Ordered in ED Medications  lactated ringers bolus 1,000 mL (has no administration in time range)  lactated ringers infusion (has no administration in time range)  dicyclomine (BENTYL) 10 MG/5ML solution 10 mg (has no administration in time range)  alum & mag hydroxide-simeth (MAALOX/MYLANTA) 200-200-20 MG/5ML suspension 30 mL (has no administration in time range)    ED Course/ Medical Decision Making/ A&P                             Medical Decision Making Amount and/or Complexity of Data Reviewed Labs: ordered. Radiology: ordered. ECG/medicine tests: ordered.  Risk OTC drugs. Prescription drug management.   Patient presented with chest and abdominal pain concern for possible perforation versus PE.  Patient's CT of the abdomen as well as CT of the chest per my interpretation showed no acute findings.  Was medicated for pain feels better.  Suspect that patient could have some very mild gastritis as he did drink alcohol yesterday evening.  Patient complain of having some pruritus and possible logic reaction.  Was treated for this and he feels better.  Patient is EKG per interpretation shows sinus bradycardia.  No signs of acute coronary ischemia.  Bones are flat here.  No concern for ACS.  Will discharge home at this time        Final Clinical  Impression(s) / ED Diagnoses Final diagnoses:  None    Rx / DC Orders ED Discharge Orders     None         Lacretia Leigh, MD 06/12/22 2142

## 2022-06-12 NOTE — ED Notes (Signed)
Patient transported to CT 

## 2022-06-12 NOTE — ED Triage Notes (Signed)
Pt BIB [facility] for CP/ABd pain since this AM.  Near syncopal episode on scene.  HIves present since last night.  Received Benadryl, 324 ASA , and 2 SL Nitro en route.  Pain free on arrival.   134/72 98 70 16,   20G L. AC

## 2022-06-14 ENCOUNTER — Telehealth: Payer: Medicare Other | Admitting: Nurse Practitioner

## 2022-06-14 DIAGNOSIS — L02421 Furuncle of right axilla: Secondary | ICD-10-CM | POA: Diagnosis not present

## 2022-06-14 MED ORDER — DOXYCYCLINE HYCLATE 100 MG PO TABS: 100.0000 mg | ORAL_TABLET | Freq: Two times a day (BID) | ORAL | 0 refills | Status: AC

## 2022-06-14 NOTE — Progress Notes (Signed)
Virtual Visit Consent   Nathaniel Collier, you are scheduled for a virtual visit with a [facility] provider today. Just as with appointments in the office, your consent must be obtained to participate. Your consent will be active for this visit and any virtual visit you may have with one of our providers in the next 365 days. If you have a MyChart account, a copy of this consent can be sent to you electronically.  As this is a virtual visit, video technology does not allow for your provider to perform a traditional examination. This may limit your provider's ability to fully assess your condition. If your provider identifies any concerns that need to be evaluated in person or the need to arrange testing (such as labs, EKG, etc.), we will make arrangements to do so. Although advances in technology are sophisticated, we cannot ensure that it will always work on either your end or our end. If the connection with a video visit is poor, the visit may have to be switched to a telephone visit. With either a video or telephone visit, we are not always able to ensure that we have a secure connection.  By engaging in this virtual visit, you consent to the provision of healthcare and authorize for your insurance to be billed (if applicable) for the services provided during this visit. Depending on your insurance coverage, you may receive a charge related to this service.  I need to obtain your verbal consent now. Are you willing to proceed with your visit today? Nathaniel Collier has provided verbal consent on 06/14/2022 for a virtual visit (video or telephone). Apolonio Schneiders, FNP  Date: 06/14/2022 10:58 AM  Virtual Visit via Video Note   I, Apolonio Schneiders, connected with  Nathaniel Collier  ([id number], June 10, 1985) on 06/14/22 at 11:00 AM EST by a video-enabled telemedicine application and verified that I am speaking with the correct person using two identifiers.  Location: Patient: Virtual Visit  Location Patient: Home Provider: Virtual Visit Location Provider: Home Office   I discussed the limitations of evaluation and management by telemedicine and the availability of in person appointments. The patient expressed understanding and agreed to proceed.    History of Present Illness: Nathaniel Collier is a 37 y.o. who identifies as a male who was assigned male at birth, and is being seen today for follow up regarding an allergic response to medication.  He had a Video Visit on 06/07/22 for boil and was started on Bactrim  He did skip one day and was drinking alcohol that day  The night of drinking he broke out in a rash, took a benadryl  Following morning he was feeling worse went to ED 06/12/22 had a full workup for epigastric pain/nausea and vomiting.   He had  CT of his chest/abdomen at that time everything returned WNL Was told to continue Bactrim as he has taken it in the past without issue   When he started Bactrim yesterday he broke out in a rash again and needed Benadryl no alcohol was involved at that time   The boil in his right armpit is improving.  He does continue to have drainage from the area  He has taken a total of 8 doses of Bactrim (4 days)     Problems:  Patient Active Problem List   Diagnosis Date Noted   Folliculitis barbae 43/15/4008    Allergies: No Known Allergies Medications:  Current Outpatient Medications:    acetaminophen (TYLENOL) 500 MG  tablet, Take 1,000 mg by mouth every 6 (six) hours as needed for pain., Disp: , Rfl:    albuterol (PROVENTIL) (2.5 MG/3ML) 0.083% nebulizer solution, Take 2.5 mg by nebulization every 6 (six) hours as needed for wheezing or shortness of breath., Disp: , Rfl:    hydrocortisone 2.5 % ointment, Apply topically 2 (two) times daily., Disp: 30 g, Rfl: 0   ibuprofen (ADVIL) 800 MG tablet, Take 1 tablet (800 mg total) by mouth 3 (three) times daily. Take with food to prevent GI upset, Disp: 21 tablet, Rfl: 0    ondansetron (ZOFRAN-ODT) 4 MG disintegrating tablet, Take 1 tablet (4 mg total) by mouth every 8 (eight) hours as needed for nausea or vomiting., Disp: 20 tablet, Rfl: 0   sulfamethoxazole-trimethoprim (BACTRIM DS) 800-160 MG tablet, Take 1 tablet by mouth 2 (two) times daily., Disp: 14 tablet, Rfl: 0   sulfamethoxazole-trimethoprim (BACTRIM DS) 800-160 MG tablet, Take 1 tablet by mouth 2 (two) times daily for 10 days., Disp: 20 tablet, Rfl: 0  Observations/Objective: Patient is well-developed, well-nourished in no acute distress.  Resting comfortably  at home.  Head is normocephalic, atraumatic.  No labored breathing.  Speech is clear and coherent with logical content.  Patient is alert and oriented at baseline.    Assessment and Plan: 1. Furuncle of right axilla Stop Bactrim will add to allergy list  Continue Claritin for the next 35-72 hours once daily  Benadryl as needed Continue warm compresses to axillar   - doxycycline (VIBRA-TABS) 100 MG tablet; Take 1 tablet (100 mg total) by mouth 2 (two) times daily for 35 days. Take with food  Dispense: 14 tablet; Refill: 0     Follow Up Instructions: I discussed the assessment and treatment plan with the patient. The patient was provided an opportunity to ask questions and all were answered. The patient agreed with the plan and demonstrated an understanding of the instructions.  A copy of instructions were sent to the patient via MyChart unless otherwise noted below.    The patient was advised to call back or seek an in-person evaluation if the symptoms worsen or if the condition fails to improve as anticipated.  Time:  I spent 10  minutes with the patient via telehealth technology discussing the above problems/concerns.    Apolonio Schneiders, FNP

## 2022-08-13 ENCOUNTER — Telehealth: Payer: Medicare Other | Admitting: Nurse Practitioner

## 2022-08-13 DIAGNOSIS — L0292 Furuncle, unspecified: Secondary | ICD-10-CM

## 2022-08-13 MED ORDER — DOXYCYCLINE HYCLATE 100 MG PO TABS: 100.0000 mg | ORAL_TABLET | Freq: Two times a day (BID) | ORAL | 0 refills | Status: AC

## 2022-08-13 MED ORDER — MUPIROCIN 2 % EX OINT: 1.0000 | TOPICAL_OINTMENT | Freq: Two times a day (BID) | CUTANEOUS | 0 refills | Status: DC

## 2022-08-13 MED ORDER — CHLORHEXIDINE GLUCONATE 4 % EX LIQD: Freq: Every day | CUTANEOUS | 1 refills | Status: DC | PRN

## 2022-08-13 NOTE — Patient Instructions (Addendum)
[email or web address] (Find a primary care )     Surgeon consultation:   [facility] [phone] [address] [place], [place] [address]

## 2022-08-13 NOTE — Progress Notes (Signed)
Virtual Visit Consent   Nathaniel Collier, you are scheduled for a virtual visit with a [facility] provider today. Just as with appointments in the office, your consent must be obtained to participate. Your consent will be active for this visit and any virtual visit you may have with one of our providers in the next 365 days. If you have a MyChart account, a copy of this consent can be sent to you electronically.  As this is a virtual visit, video technology does not allow for your provider to perform a traditional examination. This may limit your provider's ability to fully assess your condition. If your provider identifies any concerns that need to be evaluated in person or the need to arrange testing (such as labs, EKG, etc.), we will make arrangements to do so. Although advances in technology are sophisticated, we cannot ensure that it will always work on either your end or our end. If the connection with a video visit is poor, the visit may have to be switched to a telephone visit. With either a video or telephone visit, we are not always able to ensure that we have a secure connection.  By engaging in this virtual visit, you consent to the provision of healthcare and authorize for your insurance to be billed (if applicable) for the services provided during this visit. Depending on your insurance coverage, you may receive a charge related to this service.  I need to obtain your verbal consent now. Are you willing to proceed with your visit today? Nathaniel Collier has provided verbal consent on 08/13/2022 for a virtual visit (video or telephone). Apolonio Schneiders, FNP  Date: 08/13/2022 1:14 PM  Virtual Visit via Video Note   I, Apolonio Schneiders, connected with  Nathaniel Collier  ([id number], July 30, 1985) on 08/13/22 at  1:15 PM EDT by a video-enabled telemedicine application and verified that I am speaking with the correct person using two identifiers.  Location: Patient: Virtual Visit Location  Patient: Home Provider: Virtual Visit Location Provider: Home Office   I discussed the limitations of evaluation and management by telemedicine and the availability of in person appointments. The patient expressed understanding and agreed to proceed.    History of Present Illness: Nathaniel Collier is a 37 y.o. who identifies as a male who was assigned male at birth, and is being seen today for a recurrent boil.  Today this is on his left buttock  He has had this for the past 1.5 weeks  It is draining  He has had one in the same area in the past   He has not taken any antibiotics in the past 2 months Most recent occurrence and visit was in January 2024   Denies fevers   Had a reaction to Bactrim recently  Feels that even after he finishes the antibiotic he has a hardened area that resides   Also feels he has multiple other spots on his body that pop up and appear as small boils that are causing scars throughout his legs   Does not currently have PCP  Frequents UC for care  Problems:  Patient Active Problem List   Diagnosis Date Noted   Folliculitis barbae [id number]    Allergies:  Allergies  Allergen Reactions   Bactrim [Sulfamethoxazole-Trimethoprim] Hives   Medications:  Current Outpatient Medications:    acetaminophen (TYLENOL) 500 MG tablet, Take 1,000 mg by mouth every 6 (six) hours as needed for pain., Disp: , Rfl:    albuterol (PROVENTIL) (2.5  MG/3ML) 0.083% nebulizer solution, Take 2.5 mg by nebulization every 6 (six) hours as needed for wheezing or shortness of breath., Disp: , Rfl:    hydrocortisone 2.5 % ointment, Apply topically 2 (two) times daily., Disp: 30 g, Rfl: 0   ibuprofen (ADVIL) 800 MG tablet, Take 1 tablet (800 mg total) by mouth 3 (three) times daily. Take with food to prevent GI upset, Disp: 21 tablet, Rfl: 0   ondansetron (ZOFRAN-ODT) 4 MG disintegrating tablet, Take 1 tablet (4 mg total) by mouth every 8 (eight) hours as needed for nausea or  vomiting., Disp: 20 tablet, Rfl: 0  Observations/Objective: Patient is well-developed, well-nourished in no acute distress.  Resting comfortably  at home.  Head is normocephalic, atraumatic.  No labored breathing.  Speech is clear and coherent with logical content.  Patient is alert and oriented at baseline.    Assessment and Plan: 1. Recurrent boils  - doxycycline (VIBRA-TABS) 100 MG tablet; Take 1 tablet (100 mg total) by mouth 2 (two) times daily for 14 days.  Dispense: 28 tablet; Refill: 0 - mupirocin ointment (BACTROBAN) 2 %; Apply 1 Application topically 2 (two) times daily.  Dispense: 22 g; Refill: 0 - chlorhexidine (HIBICLENS) 4 % external liquid; Apply topically daily as needed.  Dispense: 120 mL; Refill: 1    Sent information on scheduling a primary care appointment  Also encouraged to call for surgical consultation on recurrent area to buttock as this is likely encapsulated - may start with PCP depending on wait time and need for referral    Follow Up Instructions: I discussed the assessment and treatment plan with the patient. The patient was provided an opportunity to ask questions and all were answered. The patient agreed with the plan and demonstrated an understanding of the instructions.  A copy of instructions were sent to the patient via MyChart unless otherwise noted below.    The patient was advised to call back or seek an in-person evaluation if the symptoms worsen or if the condition fails to improve as anticipated.  Time:  I spent 20 minutes with the patient via telehealth technology discussing the above problems/concerns.    Apolonio Schneiders, FNP

## 2022-10-25 IMAGING — DX DG RIBS W/ CHEST 3+V*R*
5 series · 5 of 5 positions shown · non-contrast
Comparison: None Available.

CLINICAL DATA: Right rib injury 1 week ago while playing football.
Right rib pain.

EXAM:
RIGHT RIBS AND CHEST - 3+ VIEW

[chest pa]
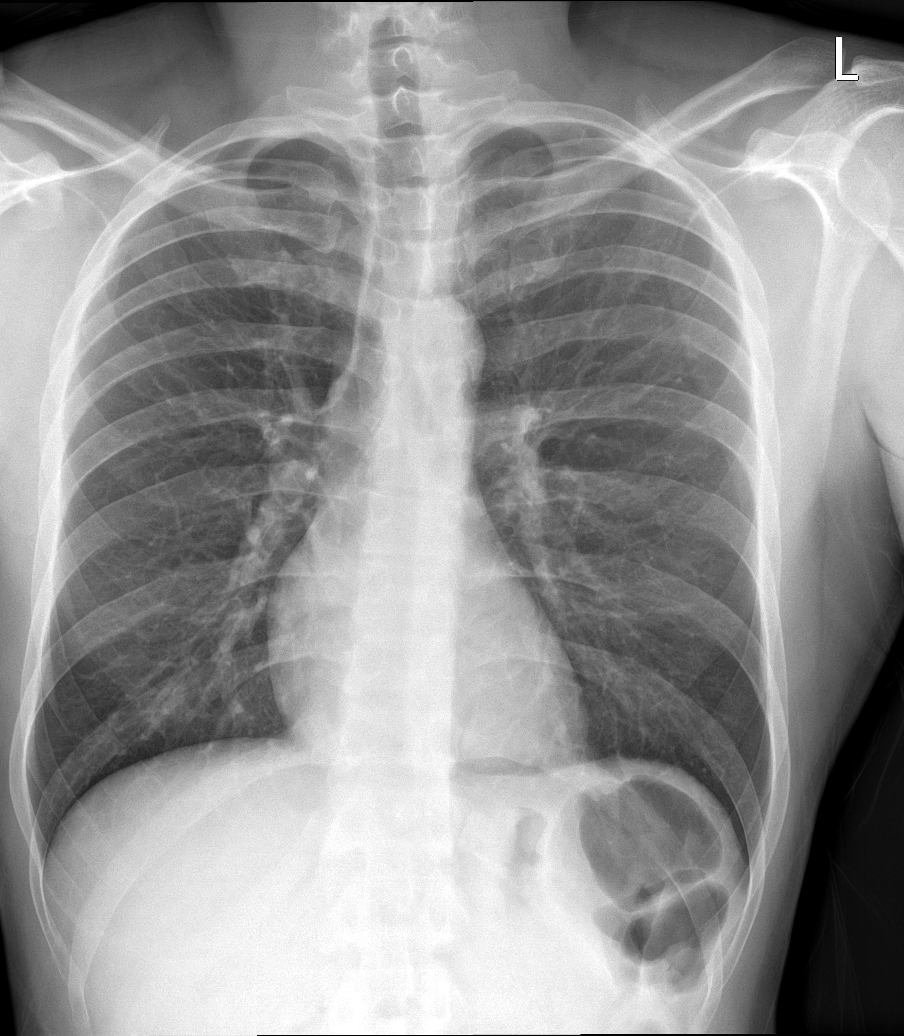

[rib pa (1 of 2)]
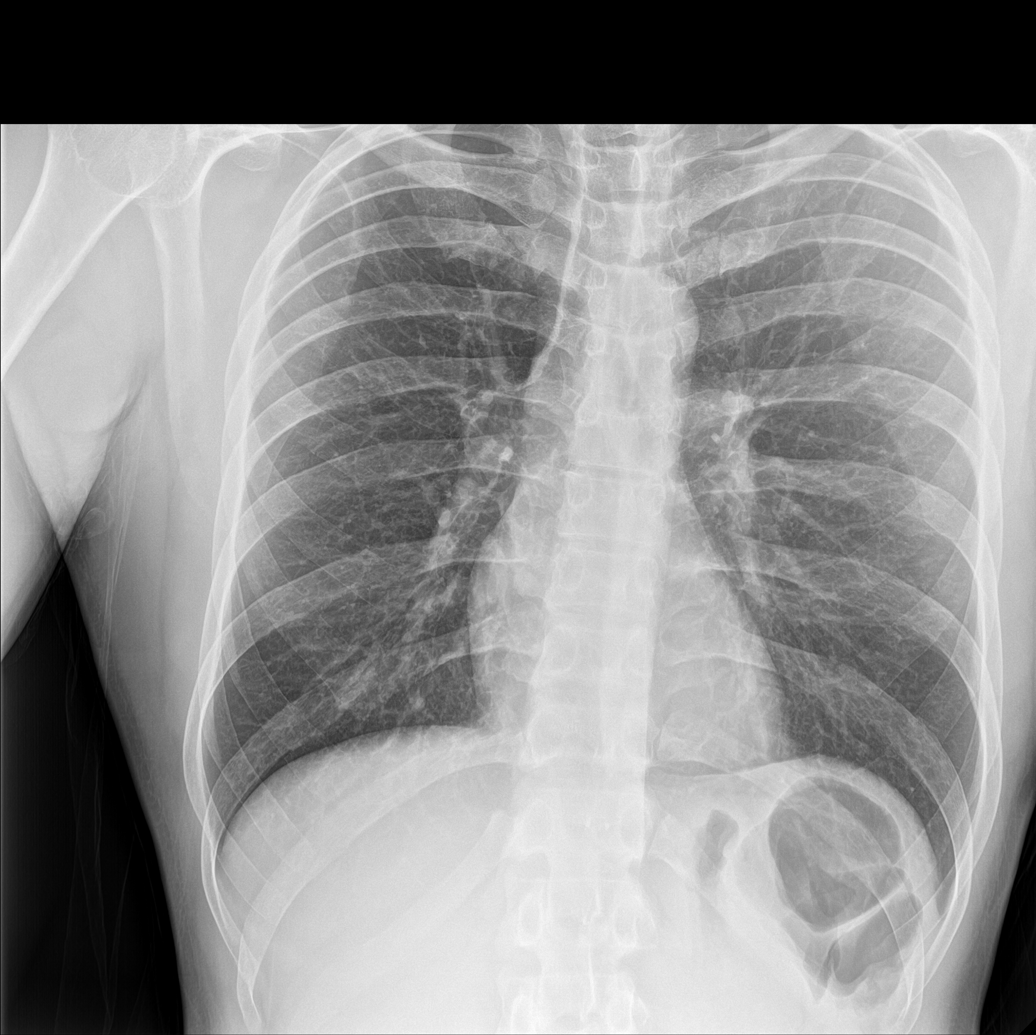

[rib obl (1 of 2)]
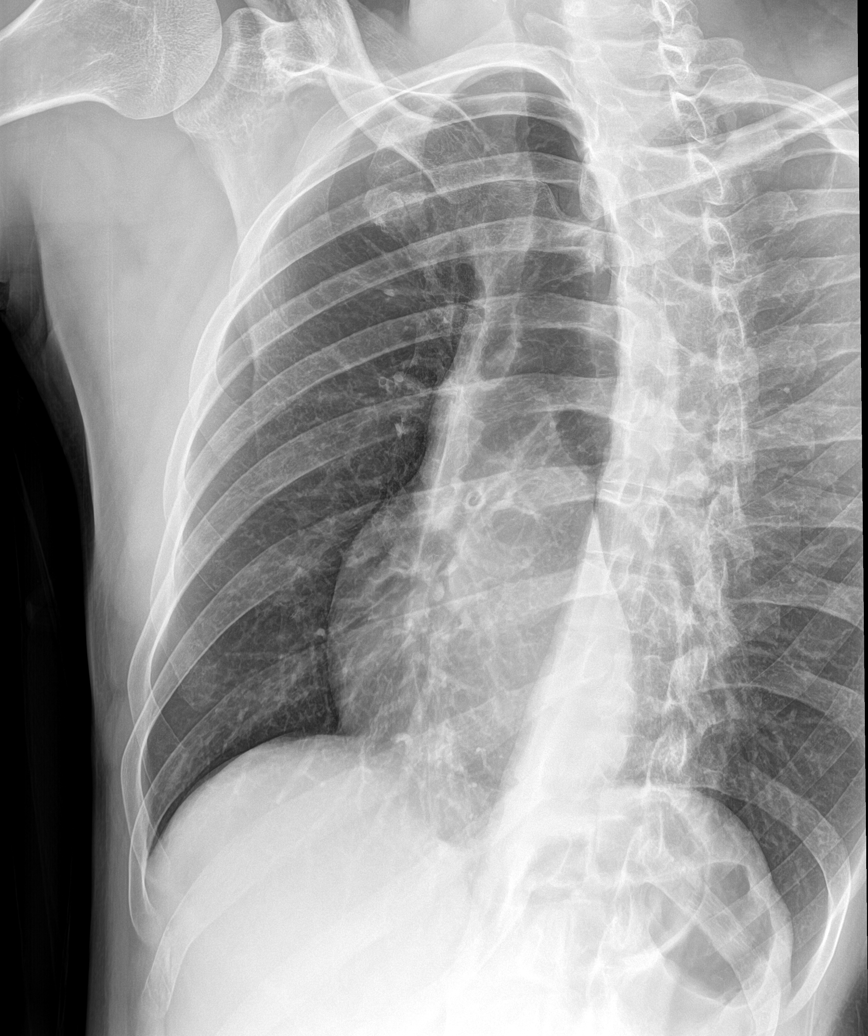

[rib obl (2 of 2)]
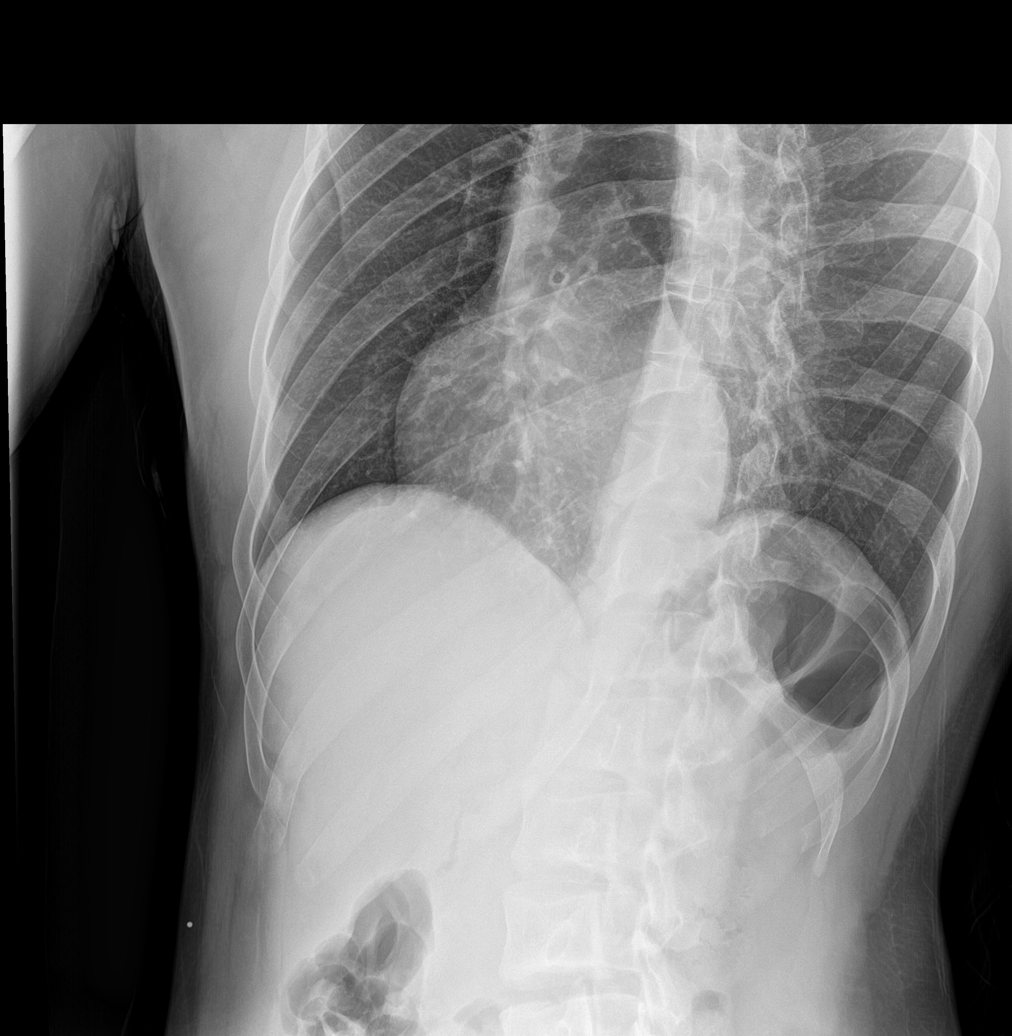

[rib pa (2 of 2)]
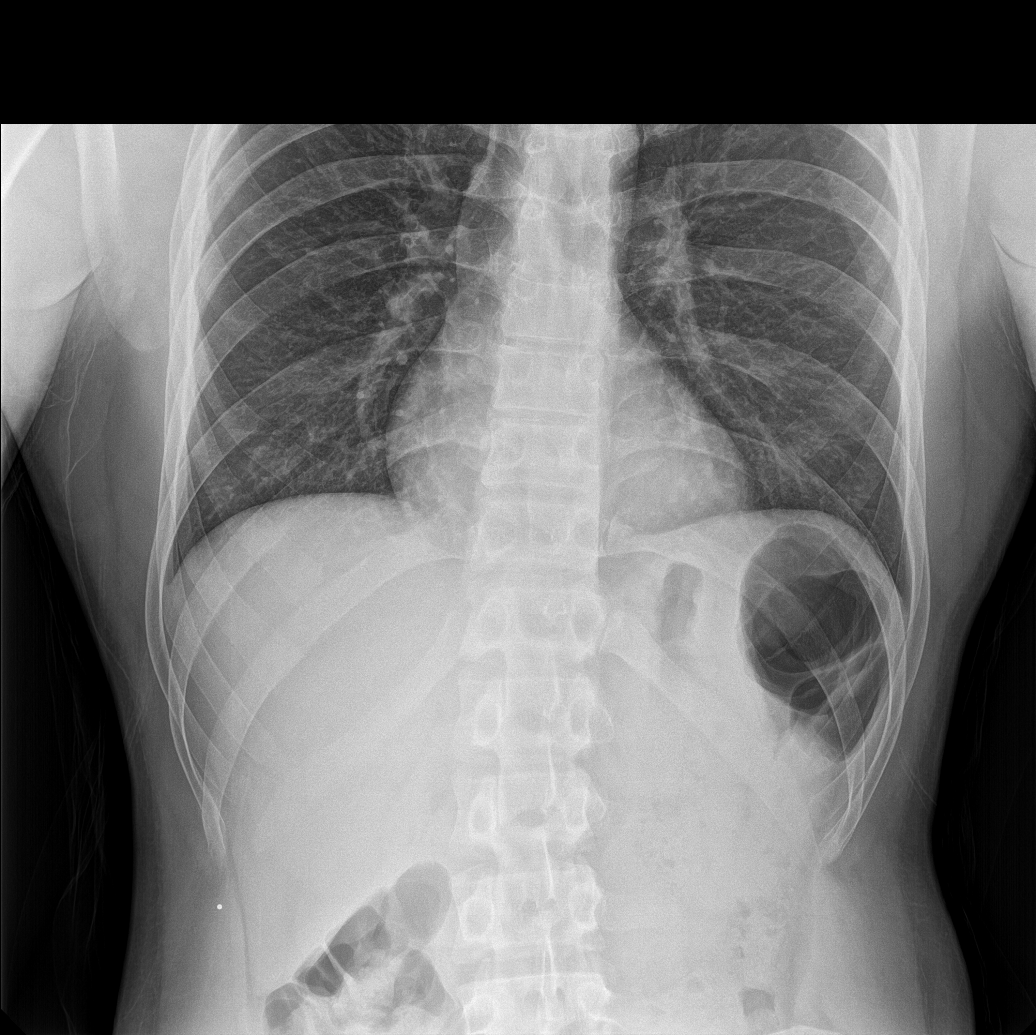

[5 of 5 positions shown; findings below may reference images not displayed]

FINDINGS: Nondisplaced fracture is seen involving the right anterior 10th rib.
No other rib fractures are identified. There is no evidence of
pneumothorax or pleural effusion. Both lungs are clear. Heart size
and mediastinal contours are within normal limits.
IMPRESSION: Nondisplaced fracture of right anterior 10th rib.

No active cardiopulmonary disease.

## 2023-04-15 ENCOUNTER — Telehealth: Payer: Medicare Other | Admitting: Nurse Practitioner

## 2023-04-15 DIAGNOSIS — L0232 Furuncle of buttock: Secondary | ICD-10-CM

## 2023-04-15 MED ORDER — MUPIROCIN 2 % EX OINT: 1.0000 | TOPICAL_OINTMENT | Freq: Two times a day (BID) | CUTANEOUS | 1 refills | Status: DC

## 2023-04-15 MED ORDER — CHLORHEXIDINE GLUCONATE 4 % EX SOLN: Freq: Every day | CUTANEOUS | 1 refills | Status: DC | PRN

## 2023-04-15 MED ORDER — DOXYCYCLINE HYCLATE 100 MG PO TABS: 100.0000 mg | ORAL_TABLET | Freq: Two times a day (BID) | ORAL | 0 refills | Status: AC

## 2023-04-15 NOTE — Progress Notes (Signed)
Virtual Visit Consent   Nathaniel Collier, you are scheduled for a virtual visit with a [facility] provider today. Just as with appointments in the office, your consent must be obtained to participate. Your consent will be active for this visit and any virtual visit you may have with one of our providers in the next 365 days. If you have a MyChart account, a copy of this consent can be sent to you electronically.  As this is a virtual visit, video technology does not allow for your provider to perform a traditional examination. This may limit your provider's ability to fully assess your condition. If your provider identifies any concerns that need to be evaluated in person or the need to arrange testing (such as labs, EKG, etc.), we will make arrangements to do so. Although advances in technology are sophisticated, we cannot ensure that it will always work on either your end or our end. If the connection with a video visit is poor, the visit may have to be switched to a telephone visit. With either a video or telephone visit, we are not always able to ensure that we have a secure connection.  By engaging in this virtual visit, you consent to the provision of healthcare and authorize for your insurance to be billed (if applicable) for the services provided during this visit. Depending on your insurance coverage, you may receive a charge related to this service.  I need to obtain your verbal consent now. Are you willing to proceed with your visit today? Nathaniel Collier has provided verbal consent on 04/15/2023 for a virtual visit (video or telephone). Nathaniel Simas, Nathaniel Collier  Date: 04/15/2023 6:00 PM  Virtual Visit via Video Note   I, Nathaniel Collier, connected with  Nathaniel Collier  ([id number], [birth year]) on 04/15/23 at  6:00 PM EST by a video-enabled telemedicine application and verified that I am speaking with the correct person using two identifiers.  Location: Patient: Virtual Visit  Location Patient: Home Provider: Virtual Visit Location Provider: Home Office   I discussed the limitations of evaluation and management by telemedicine and the availability of in person appointments. The patient expressed understanding and agreed to proceed.    History of Present Illness: Nathaniel Collier is a 37 y.o. who identifies as a male who was assigned male at birth, and is being seen today for a recurrent boil on his buttock.   Symptom onset was yesterday    He has been treated for abscesses multiple times in the past.  Most recent treatment was in March (8 months ago)  and he was given doxycycline at that time  And has not had recurrence since that time   He has been using mupirocin as needed since that time  He also washed with Chlorhexidine as well   He works as a Naval architect    History noted back to 2016 of folliculitis barbae    No fever no systemic symptoms today   Problems:  Patient Active Problem List   Diagnosis Date Noted   Folliculitis barbae 09/05/2014    Allergies:  Allergies  Allergen Reactions   Bactrim [Sulfamethoxazole-Trimethoprim] Hives   Medications:  Current Outpatient Medications:    acetaminophen (TYLENOL) 500 MG tablet, Take 1,000 mg by mouth every 6 (six) hours as needed for pain., Disp: , Rfl:    albuterol (PROVENTIL) (2.5 MG/3ML) 0.083% nebulizer solution, Take 2.5 mg by nebulization every 6 (six) hours as needed for wheezing or shortness of breath., Disp: ,  Rfl:    chlorhexidine (HIBICLENS) 4 % external liquid, Apply topically daily as needed., Disp: 120 mL, Rfl: 1   hydrocortisone 2.5 % ointment, Apply topically 2 (two) times daily., Disp: 30 g, Rfl: 0   ibuprofen (ADVIL) 800 MG tablet, Take 1 tablet (800 mg total) by mouth 3 (three) times daily. Take with food to prevent GI upset, Disp: 21 tablet, Rfl: 0   mupirocin ointment (BACTROBAN) 2 %, Apply 1 Application topically 2 (two) times daily., Disp: 22 g, Rfl: 0   ondansetron  (ZOFRAN-ODT) 4 MG disintegrating tablet, Take 1 tablet (4 mg total) by mouth every 8 (eight) hours as needed for nausea or vomiting., Disp: 20 tablet, Rfl: 0  Observations/Objective: Patient is well-developed, well-nourished in no acute distress.  Resting comfortably  at home.  Head is normocephalic, atraumatic.  No labored breathing.  Speech is clear and coherent with logical content.  Patient is alert and oriented at baseline.    Assessment and Plan:   1. Boil, buttock  - doxycycline (VIBRA-TABS) 100 MG tablet; Take 1 tablet (100 mg total) by mouth 2 (two) times daily for 10 days.  Dispense: 20 tablet; Refill: 0 - mupirocin ointment (BACTROBAN) 2 %; Apply 1 Application topically 2 (two) times daily.  Dispense: 30 g; Refill: 1 - chlorhexidine (HIBICLENS) 4 % external liquid; Apply topically daily as needed (use twice weekly as needed).  Dispense: 236 mL; Refill: 1     Follow Up Instructions: I discussed the assessment and treatment plan with the patient. The patient was provided an opportunity to ask questions and all were answered. The patient agreed with the plan and demonstrated an understanding of the instructions.  A copy of instructions were sent to the patient via MyChart unless otherwise noted below.    The patient was advised to call back or seek an in-person evaluation if the symptoms worsen or if the condition fails to improve as anticipated.    Nathaniel Simas, Nathaniel Collier

## 2023-05-25 ENCOUNTER — Other Ambulatory Visit (INDEPENDENT_AMBULATORY_CARE_PROVIDER_SITE_OTHER): Payer: Self-pay | Admitting: Nurse Practitioner

## 2023-05-25 DIAGNOSIS — L0291 Cutaneous abscess, unspecified: Secondary | ICD-10-CM

## 2023-11-18 DIAGNOSIS — F17200 Nicotine dependence, unspecified, uncomplicated: Secondary | ICD-10-CM | POA: Diagnosis not present

## 2023-11-18 DIAGNOSIS — R072 Precordial pain: Secondary | ICD-10-CM | POA: Diagnosis not present

## 2023-11-18 DIAGNOSIS — I498 Other specified cardiac arrhythmias: Secondary | ICD-10-CM | POA: Diagnosis not present

## 2023-11-18 DIAGNOSIS — R0789 Other chest pain: Secondary | ICD-10-CM | POA: Diagnosis not present

## 2023-12-30 ENCOUNTER — Ambulatory Visit
Admission: EM | Admit: 2023-12-30 | Discharge: 2023-12-30 | Disposition: A | Discharge status: Home / Self Care | Source: Ambulatory Visit

## 2023-12-30 DIAGNOSIS — L989 Disorder of the skin and subcutaneous tissue, unspecified: Secondary | ICD-10-CM | POA: Diagnosis not present

## 2023-12-30 NOTE — ED Provider Notes (Signed)
 [facility]    CSN: [id number] Arrival date & time: 12/30/23  1539      History   Chief Complaint Chief Complaint  Patient presents with   Abscess    HPI Nathaniel Collier is a 38 y.o. male.   Nathaniel Collier is a 38 y.o. male presenting for chief complaint of Abscess.  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 He had questions for the DOT examiner about his skin condition and was directed to urgent care for evaluation.  He has had this condition for a long time, reports frequent abscess to various areas of the body including the pilonidal space, axilla, beltline, groin, and legs.  He has never been diagnosed with hidradenitis suppurativa.   He is here today because he is concerned for potential MRSA infection.  Denies active drainage from lesions, fever, chills, swelling, abscess formation, and recent antibiotic or steroid use.  No history of immunosuppression to his knowledge.   He was seen in the emergency room last year for similar symptoms where a dermatology referral was supposedly placed, however he never received a phone call and never followed up. He has Medicare and therefore a referral to dermatology will need to come from his primary care provider, however he does not have a PCP.  His wife set him up with a PCP visit today, his appointment is on January 22, 2024.   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     Home Medications    Prior to Admission medications   Medication Sig Start Date End Date Taking? Authorizing Provider  acetaminophen  (TYLENOL ) 500 MG tablet Take 1,000 mg by mouth every 6 (six) hours as needed for pain.    [provider]  albuterol  (PROVENTIL ) (2.5 MG/3ML) 0.083% nebulizer solution  Take 2.5 mg by nebulization every 6 (six) hours as needed for wheezing or shortness of breath.    [provider]  chlorhexidine  (HIBICLENS ) 4 % external liquid Apply topically daily as needed (use twice weekly as needed). 04/15/23   Kennyth Domino, FNP  hydrocortisone  2.5 % ointment Apply topically 2 (two) times daily. 10/08/21   Kennyth Domino, FNP  ibuprofen  (ADVIL ) 800 MG tablet Take 1 tablet (800 mg total) by mouth 3 (three) times daily. Take with food to prevent GI upset 11/03/21   Blue, Soijett A, PA-C  mupirocin  ointment (BACTROBAN ) 2 % Apply 1 Application topically 2 (two) times daily. 04/15/23   Kennyth Domino, FNP  ondansetron  (ZOFRAN -ODT) 4 MG disintegrating tablet Take 1 tablet (4 mg total) by mouth every 8 (eight) hours as needed for nausea or vomiting. 08/08/21   Gladis Elsie BROCKS, PA-C    Family History Family History  Problem Relation Age of Onset   Asthma Mother     Social History Social History   Tobacco Use   Smoking status: Every Day    Current packs/day: 0.30    Types: Cigarettes   Smokeless tobacco: Never  Substance Use Topics   Alcohol use: No   Drug use: No     Allergies   Bactrim  [sulfamethoxazole -trimethoprim ]   Review of Systems Review of Systems Per HPI  Physical Exam Triage Vital Signs ED Triage Vitals  Encounter Vitals Group  BP 12/30/23 1549 (!) 148/106     Girls Systolic BP Percentile --      Girls Diastolic BP Percentile --      Boys Systolic BP Percentile --      Boys Diastolic BP Percentile --      Pulse Rate 12/30/23 1549 66     Resp 12/30/23 1549 16     Temp 12/30/23 1549 (!) 97.4 F (36.3 C)     Temp Source 12/30/23 1549 Oral     SpO2 12/30/23 1549 98 %     Weight --      Height --      Head Circumference --      Peak Flow --      Pain Score 12/30/23 1558 0     Pain Loc --      Pain Education --      Exclude from Growth Chart --    No data found.  Updated Vital Signs BP (!) 148/106 (BP Location: Right Arm)    Pulse 66   Temp (!) 97.4 F (36.3 C) (Oral)   Resp 16   SpO2 98%   Visual Acuity Right Eye Distance:   Left Eye Distance:   Bilateral Distance:    Right Eye Near:   Left Eye Near:    Bilateral Near:     Physical Exam Vitals and nursing note reviewed.  Constitutional:      Appearance: He is not ill-appearing or toxic-appearing.  HENT:     Head: Normocephalic and atraumatic.     Right Ear: Hearing and external ear normal.     Left Ear: Hearing and external ear normal.     Nose: Nose normal.     Mouth/Throat:     Lips: Pink.  Eyes:     General: Lids are normal. Vision grossly intact. Gaze aligned appropriately.     Extraocular Movements: Extraocular movements intact.     Conjunctiva/sclera: Conjunctivae normal.  Pulmonary:     Effort: Pulmonary effort is normal.  Musculoskeletal:     Cervical back: Neck supple.  Skin:    General: Skin is warm and dry.     Capillary Refill: Capillary refill takes less than 2 seconds.     Findings: No rash.     Comments: Scattered hyperpigmented lesions to the skin, no active abscesses or draining skin lesions.   Neurological:     General: No focal deficit present.     Mental Status: He is alert and oriented to person, place, and time. Mental status is at baseline.     Cranial Nerves: No dysarthria or facial asymmetry.  Psychiatric:        Mood and Affect: Mood normal.        Speech: Speech normal.        Behavior: Behavior normal.        Thought Content: Thought content normal.        Judgment: Judgment normal.      UC Treatments / Results  Labs (all labs ordered are listed, but only abnormal results are displayed) Labs Reviewed - No data to display  EKG   Radiology No results found.  Procedures Procedures (including critical care time)  Medications Ordered in UC Medications - No data to display  Initial Impression / Assessment and Plan / UC Course  I have reviewed the triage vital signs and the nursing  notes.  Pertinent labs & imaging results that were available during my care of the patient were reviewed by  me and considered in my medical decision making (see chart for details).   1.  Skin lesions Patient likely has hidradenitis suppurativa.  He has never been evaluated by dermatologist in the past.  Unfortunately, due to his Medicare insurance, I am unable to complete referral today to dermatology. He will need to obtain referral to dermatology specialist from his PCP at his appointment on January 22, 2024.  He is agreeable with this.  We had a long discussion about hidradenitis suppurativa and management of this via dermatology. Reassurance provided regarding MRSA skin infections.  Recommend use of cotton clothing and prevention of excessive moisture buildup to sweat gland areas to prevent infection from forming to cystic lesions to the body.  No actively draining or infected lesions today, therefore deferred wound culture/antibiotics.   Counseled patient on potential for adverse effects with medications prescribed/recommended today, strict ER and return-to-clinic precautions discussed, patient verbalized understanding.    Final Clinical Impressions(s) / UC Diagnoses   Final diagnoses:  Skin lesions   Discharge Instructions   None    ED Prescriptions   None    PDMP not reviewed this encounter.   Enedelia Dorna HERO, [place] 12/30/23 [phone]

## 2023-12-30 NOTE — ED Triage Notes (Signed)
 Patient presents to UC for evaluation of old scars from hx of abscesses. 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   Denies pain, drainage, or fever.

## 2024-02-27 ENCOUNTER — Ambulatory Visit: Attending: Obstetrics and Gynecology

## 2024-02-27 DIAGNOSIS — Z31441 Encounter for testing of male partner of patient with recurrent pregnancy loss: Secondary | ICD-10-CM | POA: Diagnosis not present

## 2024-03-02 ENCOUNTER — Telehealth: Admitting: Physician Assistant

## 2024-03-02 DIAGNOSIS — L0232 Furuncle of buttock: Secondary | ICD-10-CM

## 2024-03-02 MED ORDER — CEPHALEXIN 500 MG PO CAPS: 500.0000 mg | ORAL_CAPSULE | Freq: Two times a day (BID) | ORAL | 0 refills | Status: AC

## 2024-03-02 MED ORDER — MUPIROCIN 2 % EX OINT: 1.0000 | TOPICAL_OINTMENT | Freq: Two times a day (BID) | CUTANEOUS | 1 refills | Status: DC

## 2024-03-02 NOTE — Progress Notes (Signed)
 Virtual Visit Consent   Nathaniel Collier, you are scheduled for a virtual visit with a [facility] provider today. Just as with appointments in the office, your consent must be obtained to participate. Your consent will be active for this visit and any virtual visit you may have with one of our providers in the next 365 days. If you have a MyChart account, a copy of this consent can be sent to you electronically.  As this is a virtual visit, video technology does not allow for your provider to perform a traditional examination. This may limit your provider's ability to fully assess your condition. If your provider identifies any concerns that need to be evaluated in person or the need to arrange testing (such as labs, EKG, etc.), we will make arrangements to do so. Although advances in technology are sophisticated, we cannot ensure that it will always work on either your end or our end. If the connection with a video visit is poor, the visit may have to be switched to a telephone visit. With either a video or telephone visit, we are not always able to ensure that we have a secure connection.  By engaging in this virtual visit, you consent to the provision of healthcare and authorize for your insurance to be billed (if applicable) for the services provided during this visit. Depending on your insurance coverage, you may receive a charge related to this service.  I need to obtain your verbal consent now. Are you willing to proceed with your visit today? Nathaniel Collier has provided verbal consent on 03/02/2024 for a virtual visit (video or telephone). Nathaniel Collier, [place]  Date: 03/02/2024 10:04 AM   Virtual Visit via Video Note   I, Nathaniel Collier, connected with  Margarito Dehaas Leavitt  ([id number], [birth year]) on 03/02/24 at 10:00 AM EDT by a video-enabled telemedicine application and verified that I am speaking with the correct person using two identifiers.  Location: Patient:  Virtual Visit Location Patient: Home Provider: Virtual Visit Location Provider: Home Office   I discussed the limitations of evaluation and management by telemedicine and the availability of in person appointments. The patient expressed understanding and agreed to proceed.    History of Present Illness: Nathaniel Collier is a 38 y.o. who identifies as a male who was assigned male at birth, and is being seen today for a recurrence of a boil on his buttock. Has had issue in the same spot in the past but has not had flare in over a year thanks to use of Hibiclens  2 x weekly. Notes over past 2 days with painful boil, now draining on its own. Denies fever, chills. Is taking OTC Tylenol .   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  Resting comfortably  at home.  Head is normocephalic, atraumatic.  No labored breathing. Speech is clear and coherent with logical content.  Patient is alert and oriented at baseline.   Assessment and Plan: 1.  Boil, buttock - mupirocin  ointment (BACTROBAN ) 2 %; Apply 1 Application topically 2 (two) times daily.  Dispense: 30 g; Refill: 1 - cephALEXin  (KEFLEX ) 500 MG capsule; Take 1 capsule (500 mg total) by mouth 2 (two) times daily for 7 days.  Dispense: 14 capsule; Refill: 0  Continue Hibiclens  2 x weekly. Start warm compresses. Will add on topical Mupirocin . If continuing to progress despite compresses, he is to take the  Keflex  as directed.  Follow Up Instructions: I discussed the assessment and treatment plan with the patient. The patient was provided an opportunity to ask questions and all were answered. The patient agreed with the plan and demonstrated an understanding of the instructions.  A copy of instructions were sent to the patient via MyChart unless otherwise noted below.   The patient was advised to call back or seek an in-person evaluation if the symptoms worsen or if the condition fails to improve as anticipated.    Nathaniel Nathaniel Lunger, PA-C

## 2024-03-02 NOTE — Patient Instructions (Signed)
  Curtistine Dames Edman, thank you for joining Elsie Velma Lunger, PA-C for today's virtual visit.  While this provider is not your primary care provider (PCP), if your PCP is located in our provider database this encounter information will be shared with them immediately following your visit.   A [place] MyChart account gives you access to today's visit and all your visits, tests, and labs performed at [facility]  click here if you don't have a [place] MyChart account or go to [email or web address]  Consent: (Patient) Curtistine Dames Moe provided verbal consent for this virtual visit at the beginning of the encounter.  Current Medications:  Current Outpatient Medications:    acetaminophen  (TYLENOL ) 500 MG tablet, Take 1,000 mg by mouth every 6 (six) hours as needed for pain., Disp: , Rfl:    albuterol  (PROVENTIL ) (2.5 MG/3ML) 0.083% nebulizer solution, Take 2.5 mg by nebulization every 6 (six) hours as needed for wheezing or shortness of breath., Disp: , Rfl:    chlorhexidine  (HIBICLENS ) 4 % external liquid, Apply topically daily as needed (use twice weekly as needed)., Disp: 236 mL, Rfl: 1   hydrocortisone  2.5 % ointment, Apply topically 2 (two) times daily., Disp: 30 g, Rfl: 0   ibuprofen  (ADVIL ) 800 MG tablet, Take 1 tablet (800 mg total) by mouth 3 (three) times daily. Take with food to prevent GI upset, Disp: 21 tablet, Rfl: 0   mupirocin  ointment (BACTROBAN ) 2 %, Apply 1 Application topically 2 (two) times daily., Disp: 30 g, Rfl: 1   ondansetron  (ZOFRAN -ODT) 4 MG disintegrating tablet, Take 1 tablet (4 mg total) by mouth every 8 (eight) hours as needed for nausea or vomiting., Disp: 20 tablet, Rfl: 0   Medications ordered in this encounter:  No orders of the defined types were placed in this encounter.    *If you need refills on other medications prior to your next appointment, please contact your pharmacy*  Follow-Up: Call back or seek an in-person  evaluation if the symptoms worsen or if the condition fails to improve as anticipated.  [facility] [phone]  Other Instructions Keep the skin clean and dry. Try to stay off of the area as much as possible. Warm compresses to help promote drainage. I did refill the Mupirocin  for you. Continue Hibiclens  2 x weekly. If boil not continuing to drain/resolve over next couple of days or any worsening of symptoms, please start and take the Keflex  as directed.   If you have been instructed to have an in-person evaluation today at a local Urgent Care facility, please use the link below. It will take you to a list of all of our available [facility], including address, phone number and hours of operation. Please do not delay care.  [facility]  If you or a family member do not have a primary care provider, use the link below to schedule a visit and establish care. When you choose a [facility] primary care physician or advanced practice provider, you gain a long-term partner in health. Find a Primary Care Provider  Learn more about [facility] in-office and virtual care options: [facility] - Get Care Now

## 2024-03-12 ENCOUNTER — Encounter: Payer: Self-pay | Admitting: Emergency Medicine

## 2024-03-12 ENCOUNTER — Ambulatory Visit
Admission: EM | Admit: 2024-03-12 | Discharge: 2024-03-12 | Disposition: A | Discharge status: Home / Self Care | Attending: Nurse Practitioner | Admitting: Nurse Practitioner

## 2024-03-12 ENCOUNTER — Ambulatory Visit: Admitting: Radiology

## 2024-03-12 DIAGNOSIS — S93401A Sprain of unspecified ligament of right ankle, initial encounter: Secondary | ICD-10-CM

## 2024-03-12 DIAGNOSIS — S99911A Unspecified injury of right ankle, initial encounter: Secondary | ICD-10-CM | POA: Diagnosis not present

## 2024-03-12 DIAGNOSIS — M25571 Pain in right ankle and joints of right foot: Secondary | ICD-10-CM | POA: Diagnosis not present

## 2024-03-12 MED ORDER — IBUPROFEN 800 MG PO TABS: 800.0000 mg | ORAL_TABLET | Freq: Three times a day (TID) | ORAL | 0 refills | Status: AC | PRN

## 2024-03-12 MED ORDER — IBUPROFEN 800 MG PO TABS
800.0000 mg | ORAL_TABLET | Freq: Once | ORAL | Status: AC
  Administered 2024-03-12: 800 mg via ORAL

## 2024-03-12 NOTE — ED Triage Notes (Signed)
 Pt c/o right ankle pain after he hopped a fence at son's football game and landed wrong on ankle. Since then he has had throbbing pain and not able to put weight on foot.
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
## 2024-03-12 NOTE — Discharge Instructions (Addendum)
 You were seen today for a right ankle injury. Your X-ray did not show any broken bones, which means the injury is most consistent with a sprain. This happens when the ligaments of the ankle are stretched or strained. The ankle brace placed today will help support the area while it heals. Crutches were provided, and you may put weight on the ankle as tolerated, stopping if the pain increases.  To help with swelling and pain, rest the ankle and avoid activities that cause discomfort. Apply ice for 15-20 minutes at a time several times a day. Keep the ankle elevated when sitting or lying down. You may use the brace for support throughout the day. You were prescribed Motrin  800 mg to take as needed for pain; do not take additional over-the-counter NSAIDs (such as ibuprofen , Advil , Aleve , or naproxen ) while taking this.  If needed, you may take Tylenol  (acetaminophen ) 1000 mg every six hours for additional pain relief. This equals two 500 mg tablets at a time. Be careful not to take more than 4000 mg of Tylenol  in a 24-hour period.  Most sprains begin to improve within a few days, but full recovery may take 2-3 weeks. If the pain and swelling are not improving after about a week, or if the ankle continues to feel unstable, schedule follow-up with your primary care provider or an orthopedic specialist. Go to the emergency department right away if you develop increasing swelling, inability to move your toes, numbness that does not go away, severe pain that does not improve with medication, or if the foot becomes cold or pale.

## 2024-03-13 LAB — CHROMOSOME, BLOOD, ROUTINE
Cells Analyzed: 20
Cells Counted: 20
Cells Karyotyped: 2
GTG Band Resolution Achieved: 500

## 2024-03-16 ENCOUNTER — Telehealth: Payer: Self-pay

## 2024-03-16 NOTE — Telephone Encounter (Signed)
 I spoke with the patient and his wife to return their karyotype results.  Karyotype returned as normal 46, XY. No balanced chromosome translocations were detected that would increase risk for miscarriage or infertility. This does not exclude subtle rearrangements that would not be detected by a karyotype.   His wife Rolin Moats DOB 10/04/1990 also had a normal karyotype 42, XX.  Lauraine Bodily, MS, [facility] Certified Genetic Counselor [facility] [phone]

## 2024-06-03 ENCOUNTER — Encounter

## 2024-06-03 ENCOUNTER — Telehealth: Admitting: Physician Assistant

## 2024-06-03 DIAGNOSIS — L0501 Pilonidal cyst with abscess: Secondary | ICD-10-CM | POA: Diagnosis not present

## 2024-06-03 MED ORDER — MUPIROCIN 2 % EX OINT: 1.0000 | TOPICAL_OINTMENT | Freq: Two times a day (BID) | CUTANEOUS | 0 refills | Status: AC

## 2024-06-03 MED ORDER — DOXYCYCLINE HYCLATE 100 MG PO TABS: 100.0000 mg | ORAL_TABLET | Freq: Two times a day (BID) | ORAL | 0 refills | Status: AC

## 2024-06-03 NOTE — Patient Instructions (Signed)
 " Nathaniel Collier, thank you for joining Nathaniel Velma Lunger, PA-C for today's virtual visit.  While this provider is not your primary care provider (PCP), if your PCP is located in our provider database this encounter information will be shared with them immediately following your visit.   A [facility] MyChart account gives you access to today's visit and all your visits, tests, and labs performed at [facility]  click here if you don't have a [facility] MyChart account or go to [email or web address]  Consent: (Patient) Nathaniel Collier provided verbal consent for this virtual visit at the beginning of the encounter.  Current Medications:  Current Outpatient Medications:    ibuprofen  (ADVIL ) 800 MG tablet, Take 1 tablet (800 mg total) by mouth every 8 (eight) hours as needed (pain). Take with food to avoid stomach upset. Do not take any additional NSAIDs while on this. You may take tylenol  in addition to this if needed for extra pain relief., Disp: 21 tablet, Rfl: 0   mupirocin  ointment (BACTROBAN ) 2 %, Apply 1 Application topically 2 (two) times daily., Disp: 30 g, Rfl: 1   Medications ordered in this encounter:  No orders of the defined types were placed in this encounter.    *If you need refills on other medications prior to your next appointment, please contact your pharmacy*  Follow-Up: Call back or seek an in-person evaluation if the symptoms worsen or if the condition fails to improve as anticipated.  [facility] [phone]  Other Instructions Pilonidal Cyst  A pilonidal cyst is a fluid-filled sac that forms under the skin near the tailbone, at the top of the crease of the buttocks (pilonidal area). Cysts that are small and not infected may not cause any problems. Cysts that become irritated or infected may grow and fill with pus. An infected cyst is called an abscess. A pilonidal abscess may cause pain and swelling. It may need to be  drained or removed. What are the causes? The cause of this condition is not always known. In some cases, it may be caused by a hair that grows into your skin (ingrown hair). What increases the risk? You are more likely to develop this condition if: You are male. You have lots of hair near the crease of the buttocks. You are overweight. You have a dimple near the crease of the buttocks. You wear tight clothing. You do not bathe or shower often. You sit for long periods of time. What are the signs or symptoms? Symptoms of this condition may include pain, swelling, redness, and warmth in the pilonidal area. You may also be able to feel a lump near your tailbone if your cyst is big. If your cyst becomes infected, symptoms may include: Pus or fluid drainage. Fever. Pain, swelling, and redness getting worse. The lump getting bigger. How is this diagnosed? This condition may be diagnosed based on: Your symptoms and medical history. A physical exam. A blood test to check for infection. A test of a pus sample. How is this treated? You may not need any treatment if your cyst does not cause symptoms. If your cyst bothers you or is infected, you may need a procedure to drain or remove the cyst. Depending on the size, location, and severity of your cyst, your health care provider may: Make an incision in the cyst and drain it (incision anddrainage). Open and drain the cyst, and then stitch the wound so that it stays open while it heals (  marsupialization). You will be given instructions about how to care for your open wound while it heals. Remove all or part of the cyst, and then close the wound (cyst removal). You may need to take antibiotics before your procedure. Follow these instructions at home: Medicines Take over-the-counter and prescription medicines only as told by your health care provider. If you were prescribed antibiotics, take them as told by your health care provider. Do not stop  taking the antibiotic even if you start to feel better. General instructions Keep the area around the cyst clean and dry. If there is fluid or pus draining from your cyst: Cover the area with a clean bandage (dressing). Wash the area gently with soap and water. Pat the area dry with a clean towel. Do not rub the area because that may cause bleeding. Remove hair from the area around the cyst only if your health care provider tells you to. Do not wear tight pants or sit in one position for long periods at a time. Contact a health care provider if: You have new redness, swelling, or pain. You have a fever. You have severe pain. Summary A pilonidal cyst is a fluid-filled sac that forms under the skin near the tailbone, at the top of the crease of the buttocks (pilonidal area). Cysts that become irritated or infected may grow and fill with pus. An infected cyst is called an abscess. The cause of this condition is not always known. In some cases, it may be caused by a hair that grows into your skin (ingrown hair). You may not need any treatment if your cyst does not cause symptoms. If your cyst bothers you or is infected, you may need a procedure to drain or remove the cyst. This information is not intended to replace advice given to you by your health care provider. Make sure you discuss any questions you have with your health care provider. Document Revised: 08/01/2021 Document Reviewed: 08/01/2021 Elsevier Patient Education  2024 [employer]   If you have been instructed to have an in-person evaluation today at a local Urgent Care facility, please use the link below. It will take you to a list of all of our available [facility], including address, phone number and hours of operation. Please do not delay care.  [facility]  If you or a family member do not have a primary care provider, use the link below to schedule a visit and establish care. When you choose a [facility] primary care physician or advanced practice provider, you gain a long-term partner in health. Find a Primary Care Provider  Learn more about [facility]'s in-office and virtual care options: [facility] - Get Care Now  "

## 2024-06-03 NOTE — Progress Notes (Signed)
 " Virtual Visit Consent   Nathaniel Collier, you are scheduled for a virtual visit with a [facility] provider today. Just as with appointments in the office, your consent must be obtained to participate. Your consent will be active for this visit and any virtual visit you may have with one of our providers in the next 365 days. If you have a MyChart account, a copy of this consent can be sent to you electronically.  As this is a virtual visit, video technology does not allow for your provider to perform a traditional examination. This may limit your provider's ability to fully assess your condition. If your provider identifies any concerns that need to be evaluated in person or the need to arrange testing (such as labs, EKG, etc.), we will make arrangements to do so. Although advances in technology are sophisticated, we cannot ensure that it will always work on either your end or our end. If the connection with a video visit is poor, the visit may have to be switched to a telephone visit. With either a video or telephone visit, we are not always able to ensure that we have a secure connection.  By engaging in this virtual visit, you consent to the provision of healthcare and authorize for your insurance to be billed (if applicable) for the services provided during this visit. Depending on your insurance coverage, you may receive a charge related to this service.  I need to obtain your verbal consent now. Are you willing to proceed with your visit today? Nathaniel Collier has provided verbal consent on 37/15/2026 for a virtual visit (video or telephone). Nathaniel Collier, [place]  Date: 06/03/2024 3:39 PM   Virtual Visit via Video Note   I, Nathaniel Collier, connected with  Nathaniel Collier  ([id number], 37-18-1987) on 06/03/24 at  3:30 PM EST by a video-enabled telemedicine application and verified that I am speaking with the correct person using two identifiers.  Location: Patient:  Virtual Visit Location Patient: Home Provider: Virtual Visit Location Provider: Home Office   I discussed the limitations of evaluation and management by telemedicine and the availability of in person appointments. The patient expressed understanding and agreed to proceed.    History of Present Illness: Nathaniel Collier is a 39 y.o. who identifies as a male who was assigned male at birth, and is being seen today for start of recurrent inflammation of pilonidal cyst on the buttock. Has had issues in the past requiring acute treatment. Usually if he applies some Bactroban  at onset of symptoms it prevents but is out of this. Denies fever, chills. Is draining on its own now. Pain 7/10.  HPI: HPI  Problems:  Patient Active Problem List   Diagnosis Date Noted   Folliculitis barbae 09/05/2014    Allergies: Allergies[1] Medications: Current Medications[2]  Observations/Objective: Patient is well-developed, well-nourished in no acute distress.  Resting comfortably  at home.  Head is normocephalic, atraumatic.  No labored breathing.  Speech is clear and coherent with logical content.  Patient is alert and oriented at baseline.   Assessment and Plan: 1. Pilonidal cyst with abscess (Primary) - mupirocin  ointment (BACTROBAN ) 2 %; Apply 1 Application topically 2 (two) times daily.  Dispense: 22 g; Refill: 0 - doxycycline  (VIBRA -TABS) 100 MG tablet; Take 1 tablet (100 mg total) by mouth 2 (two) times daily.  Dispense: 14 tablet; Refill: 0  Prior history. No alarm signs or symptoms. Draining on its own. Supportive measures reviewed. Refill Bactroban . Will  place script for oral Doxycycline  for any non continued improving/resolving symptoms or for anything new/worsening. PCP follow-up discussed.   Follow Up Instructions: I discussed the assessment and treatment plan with the patient. The patient was provided an opportunity to ask questions and all were answered. The patient agreed with the plan  and demonstrated an understanding of the instructions.  A copy of instructions were sent to the patient via MyChart unless otherwise noted below.   The patient was advised to call back or seek an in-person evaluation if the symptoms worsen or if the condition fails to improve as anticipated.    Nathaniel Velma Lunger, PA-C    [1]  Allergies Allergen Reactions   Bactrim  [Sulfamethoxazole -Trimethoprim ] Hives  [2]  Current Outpatient Medications:    doxycycline  (VIBRA -TABS) 100 MG tablet, Take 1 tablet (100 mg total) by mouth 2 (two) times daily., Disp: 14 tablet, Rfl: 0   mupirocin  ointment (BACTROBAN ) 2 %, Apply 1 Application topically 2 (two) times daily., Disp: 22 g, Rfl: 0   ibuprofen  (ADVIL ) 800 MG tablet, Take 1 tablet (800 mg total) by mouth every 8 (eight) hours as needed (pain). Take with food to avoid stomach upset. Do not take any additional NSAIDs while on this. You may take tylenol  in addition to this if needed for extra pain relief., Disp: 21 tablet, Rfl: 0  "
